# Patient Record
Sex: Male | Born: 1965 | Race: Black or African American | Hispanic: No | Marital: Single | Smoking: Current every day smoker
Health system: Southern US, Community
[De-identification: ages and names within clinical notes are randomized; demographics above are authoritative.]

## PROBLEM LIST (undated history)

## (undated) DIAGNOSIS — I10 Essential (primary) hypertension: Secondary | ICD-10-CM

## (undated) DIAGNOSIS — E785 Hyperlipidemia, unspecified: Secondary | ICD-10-CM

## (undated) DIAGNOSIS — H409 Unspecified glaucoma: Secondary | ICD-10-CM

## (undated) HISTORY — PX: LEG SURGERY: SHX1003

## (undated) HISTORY — PX: LUNG SURGERY: SHX703

---

## 2002-11-30 ENCOUNTER — Ambulatory Visit (HOSPITAL_COMMUNITY): Admission: RE | Admit: 2002-11-30 | Discharge: 2002-11-30 | Payer: Self-pay | Admitting: Internal Medicine

## 2002-11-30 ENCOUNTER — Encounter: Payer: Self-pay | Admitting: Internal Medicine

## 2002-12-27 ENCOUNTER — Emergency Department (HOSPITAL_COMMUNITY): Admission: EM | Admit: 2002-12-27 | Discharge: 2002-12-27 | Payer: Self-pay | Admitting: Emergency Medicine

## 2008-05-08 ENCOUNTER — Emergency Department (HOSPITAL_COMMUNITY): Admission: EM | Admit: 2008-05-08 | Discharge: 2008-05-08 | Payer: Self-pay | Admitting: Emergency Medicine

## 2008-09-16 ENCOUNTER — Emergency Department (HOSPITAL_COMMUNITY): Admission: EM | Admit: 2008-09-16 | Discharge: 2008-09-16 | Payer: Self-pay | Admitting: Emergency Medicine

## 2008-11-27 ENCOUNTER — Emergency Department (HOSPITAL_COMMUNITY): Admission: EM | Admit: 2008-11-27 | Discharge: 2008-11-28 | Payer: Self-pay | Admitting: Emergency Medicine

## 2008-12-20 ENCOUNTER — Encounter: Admission: RE | Admit: 2008-12-20 | Discharge: 2009-03-20 | Payer: Self-pay | Admitting: Orthopaedic Surgery

## 2009-08-25 ENCOUNTER — Encounter (INDEPENDENT_AMBULATORY_CARE_PROVIDER_SITE_OTHER): Payer: Self-pay | Admitting: *Deleted

## 2009-08-25 ENCOUNTER — Ambulatory Visit: Payer: Self-pay | Admitting: Internal Medicine

## 2009-08-25 DIAGNOSIS — K921 Melena: Secondary | ICD-10-CM | POA: Insufficient documentation

## 2009-08-25 DIAGNOSIS — H5702 Anisocoria: Secondary | ICD-10-CM

## 2009-08-25 DIAGNOSIS — K089 Disorder of teeth and supporting structures, unspecified: Secondary | ICD-10-CM | POA: Insufficient documentation

## 2009-09-20 ENCOUNTER — Ambulatory Visit: Payer: Self-pay | Admitting: Gastroenterology

## 2009-11-30 ENCOUNTER — Ambulatory Visit: Payer: Self-pay | Admitting: Internal Medicine

## 2009-11-30 DIAGNOSIS — M545 Low back pain: Secondary | ICD-10-CM

## 2009-12-01 ENCOUNTER — Ambulatory Visit: Payer: Self-pay | Admitting: Internal Medicine

## 2010-07-22 LAB — CONVERTED CEMR LAB
Basophils Absolute: 0 10*3/uL (ref 0.0–0.1)
HDL: 58.9 mg/dL (ref >39.00)
Hemoglobin: 14.2 g/dL (ref 13.0–17.0)
Lymphocytes Relative: 34.6 % (ref 12.0–46.0)
Monocytes Relative: 6.6 % (ref 3.0–12.0)
Neutro Abs: 2.9 10*3/uL (ref 1.4–7.7)
Neutrophils Relative %: 56.5 % (ref 43.0–77.0)
PSA: 1.15 ng/mL (ref 0.10–4.00)
RBC: 5.09 M/uL (ref 4.22–5.81)
RDW: 14.6 % (ref 11.5–14.6)
Triglycerides: 75 mg/dL (ref 0.0–149.0)
VLDL: 15 mg/dL (ref 0.0–40.0)

## 2010-07-24 NOTE — Letter (Signed)
Summary: New Patient letter  [facility]  [address] [place], [place] [address]   Phone: [phone]  Fax: [phone]       08/25/2009 MRN: [id number]  Brian Daugherty [address] [place], [place]  [address]  Dear Mr. Brian Daugherty,  Welcome to the Gastroenterology Division at [facility]    You are scheduled to see Dr.  Russella Dar on 09-20-09 at 10:15am on the 3rd floor at [facility], [address]  We ask that you try to arrive at our office 15 minutes prior to your appointment time to allow for check-in.  We would like you to complete the enclosed self-administered evaluation form prior to your visit and bring it with you on the day of your appointment.  We will review it with you.  Also, please bring a complete list of all your medications or, if you prefer, bring the medication bottles and we will list them.  Please bring your insurance card so that we may make a copy of it.  If your insurance requires a referral to see a specialist, please bring your referral form from your primary care physician.  Co-payments are due at the time of your visit and may be paid by cash, check or credit card.     Your office visit will consist of a consult with your physician (includes a physical exam), any laboratory testing he/she may order, scheduling of any necessary diagnostic testing (e.g. x-ray, ultrasound, CT-scan), and scheduling of a procedure (e.g. Endoscopy, Colonoscopy) if required.  Please allow enough time on your schedule to allow for any/all of these possibilities.    If you cannot keep your appointment, please call [phone] to cancel or reschedule prior to your appointment date.  This allows [place] the opportunity to schedule an appointment for another patient in need of care.  If you do not cancel or reschedule by 5 p.m. the business day prior to your appointment date, you will be charged a $50.00 late cancellation/no-show fee.    Thank you for choosing  [facility] for your medical needs.  We appreciate the opportunity to care for you.  Please visit [place] at our website  to learn more about our practice.                     Sincerely,                                                             The Gastroenterology Division

## 2010-07-24 NOTE — Assessment & Plan Note (Signed)
Summary: NECK & BACK PAIN/Tydus Sanmiguel/CD   Vital Signs:  Patient profile:   45 year old male Height:      71 inches Weight:      154.75 pounds BMI:     21.66 O2 Sat:      97 % on Room air Temp:     97.7 degrees F oral Pulse rate:   73 / minute Pulse rhythm:   regular Resp:     16 per minute BP sitting:   128 / 82  (left arm) Cuff size:   large  Vitals Entered By: Rock Nephew CMA (November 30, 2009 3:17 PM)  O2 Flow:  Room air  Primary Care Provider:  Etta Grandchild MD  CC:  Back pain.  History of Present Illness:  Back Pain      This is a 45 year old man who presents with sharp- Low Back pain.  The symptoms began 5 days ago.  The intensity is described as mild.  The patient denies fever, chills, weakness, loss of sensation, fecal incontinence, urinary incontinence, urinary retention, dysuria, rest pain, inability to work, and inability to care for self.  The pain is located in the mid low back.  The pain began gradually.  The pain radiates to the right hip and left hip.  The pain is made worse by flexion, extension, and activity.  The pain is made better by inactivity and acetaminophen.    Preventive Screening-Counseling & Management  Alcohol-Tobacco     Alcohol drinks/day: 2     Alcohol type: all     >5/day in last 3 mos: no     Alcohol Counseling: not indicated; use of alcohol is not excessive or problematic     Feels need to cut down: no     Feels annoyed by complaints: no     Feels guilty re: drinking: no     Needs 'eye opener' in am: no     Smoking Status: current     Smoking Cessation Counseling: yes     Smoke Cessation Stage: precontemplative     Packs/Day: 0.25     Year Started: 1984     Pack years: 15     Tobacco Counseling: to quit use of tobacco products  Hep-HIV-STD-Contraception     Hepatitis Risk: no risk noted     HIV Risk: no risk noted     STD Risk: no risk noted     SBE monthly: yes     SBE Education/Counseling: to perform regular SBE      Sexual  History:  currently monogamous.        Drug Use:  no.        Blood Transfusions:  no.    Medications Prior to Update: 1)  None  Current Medications (verified): 1)  Amrix 15 Mg Xr24h-Cap (Cyclobenzaprine Hcl) .... One By Mouth Two Times A Day As Needed For Low Back Pain 2)  Naprelan 500 Mg Xr24h-Tab (Naproxen Sodium) .... 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 Family History of Arthritis  Social History: Reviewed history from 08/25/2009 and no changes required. 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   MS:  Complains of low back pain; denies joint pain, joint redness, joint swelling, mid back pain, muscle aches, muscle, muscle weakness, and stiffness. Neuro:  Denies brief paralysis, disturbances in coordination, numbness, poor balance, sensation of room spinning, tingling, and weakness.  Physical Exam  General:  alert, well-developed, well-nourished, well-hydrated, appropriate dress, normal appearance,  healthy-appearing, cooperative to examination, and good hygiene.   Head:  normocephalic, atraumatic, no abnormalities observed, and no abnormalities palpated.   Mouth:  Oral mucosa and oropharynx without lesions or exudates.  Teeth in good repair.no exudates.   Neck:  supple, full ROM, no masses, no thyromegaly, no thyroid nodules or tenderness, no JVD, no HJR, and no carotid bruits.   Lungs:  normal respiratory effort, no intercostal retractions, no accessory muscle use, normal breath sounds, no dullness, no fremitus, no crackles, and no wheezes.   Heart:  normal rate, regular rhythm, no murmur, no gallop, no rub, and no JVD.   Abdomen:  soft, non-tender, normal bowel sounds, no distention, no masses, no guarding, no rebound tenderness, and no inguinal hernia.   Msk:  normal ROM, no joint tenderness, no joint swelling, no joint warmth, no redness over joints, no joint deformities, no joint instability, no crepitation, and no muscle atrophy.   Pulses:  R and L carotid,radial,femoral,dorsalis pedis and posterior tibial pulses are full and equal bilaterally Extremities:  No clubbing, cyanosis, edema, or deformity noted with normal full range of motion of all joints.   Neurologic:  No cranial nerve deficits noted. Station and gait are normal. Plantar reflexes are down-going bilaterally. DTRs are symmetrical throughout. Sensory, motor and coordinative functions appear intact.   Detailed Back/Spine Exam  General:    Well-developed, well-nourished, in no acute distress; alert and oriented x 3.    Gait:    Normal heel-toe gait pattern bilaterally.    Lumbosacral Exam:  Inspection-deformity:    Normal Palpation-spinal tenderness:  Normal Range of Motion:    Forward Flexion:   60 degrees    Hyperextension:   25 degrees    Schober's:        >6 cm    Right Lateral Bend:   25 degrees    Left Lateral Bend:   25 degrees Squatting:  normal Lying Straight Leg Raise:    Right:  negative    Left:   negative Sitting Straight Leg Raise:    Right:  negative    Left:  negative Reverse Straight Leg Raise:    Right:  negative    Left:  negative Contralateral Straight Leg Raise:    Right:  negative    Left:  negative Sciatic Notch:    There is no sciatic notch tenderness. Toe Walking:    Right:  normal    Left:  normal Heel Walking:    Right:  normal    Left:  normal   Impression & Recommendations:  Problem # 1:  LOW BACK PAIN, ACUTE (ICD-724.2) Assessment New  His updated medication list for this problem includes:    Amrix 15 Mg Xr24h-cap (Cyclobenzaprine hcl) ..... One  by mouth two times a day as needed for low back pain    Naprelan 500 Mg Xr24h-tab (Naproxen sodium) ..... One by mouth once daily as needed for low back pain  Orders: T-Lumbar Spine Complete, 5 Views [phone])  Discussed use of moist heat or ice, modified activities, medications, and stretching/strengthening exercises. Back care instructions given. To be seen in 2 weeks if no improvement; sooner if worsening of symptoms.   Complete Medication List: 1)  Amrix 15 Mg Xr24h-cap (Cyclobenzaprine hcl) .... One by mouth two times a day as needed for low back pain 2)  Naprelan 500 Mg Xr24h-tab (Naproxen sodium) .... One by mouth once daily as needed for low back pain  Patient Instructions: 1)  Please schedule a follow-up appointment in 2 weeks. 2)  Take 650-1000mg  of Tylenol every 4-6 hours as needed for relief of pain or comfort of fever AVOID taking more than 4000mg   in a 24 hour period (can cause liver damage in higher doses). 3)  Most patients (90%) with low back pain will improve with time (2-6 weeks). Keep active but avoid activities that are painful. Apply moist heat and/or ice to lower back several times a day. Prescriptions: NAPRELAN 500 MG XR24H-TAB (NAPROXEN SODIUM) One by mouth once daily as needed for low back pain  #8 x 0   Entered and Authorized by:   Etta Grandchild MD   Signed by:   Etta Grandchild MD on 11/30/2009   Method used:   Samples Given   RxID:   [id number] AMRIX 15 MG XR24H-CAP (CYCLOBENZAPRINE HCL) One by mouth two times a day as needed for low back pain  #20 x 0   Entered and Authorized by:   Etta Grandchild MD   Signed by:   Etta Grandchild MD on 11/30/2009   Method used:   Telephoned to ...         RxID:   [id number]

## 2010-07-24 NOTE — Assessment & Plan Note (Signed)
Summary: new / bcbs / #/ cd   Vital Signs:  Patient profile:   45 year old male Height:      71 inches Weight:      158 pounds BMI:     22.12 O2 Sat:      97 % on Room air Temp:     98.3 degrees F oral Pulse rate:   68 / minute Pulse rhythm:   regular Resp:     16 per minute BP sitting:   112 / 82  (left arm) Cuff size:   large  Vitals Entered By: Rock Nephew CMA (August 25, 2009 8:45 AM)  O2 Flow:  Room air  Primary Care Provider:  Etta Grandchild MD   History of Present Illness: New to me for a complete physical with complaints.  1. he had hemorrhoids one month ago and he went to see a doctor in [place] and has been treated with a ? topical med./ he was having pain and BRBPR  2. One year hx. of right eye blurred vision and right pupil is larger than left  3. right upper toothache for several months  Preventive Screening-Counseling & Management  Alcohol-Tobacco     Alcohol drinks/day: 2     Alcohol type: all     >5/day in last 3 mos: no     Alcohol Counseling: not indicated; use of alcohol is not excessive or problematic     Feels need to cut down: no     Feels annoyed by complaints: no     Feels guilty re: drinking: no     Needs 'eye opener' in am: no     Smoking Status: current     Smoking Cessation Counseling: yes     Smoke Cessation Stage: precontemplative     Packs/Day: 0.25     Year Started: 1984     Pack years: 15     Tobacco Counseling: to quit use of tobacco products  Caffeine-Diet-Exercise     Does Patient Exercise: yes  Hep-HIV-STD-Contraception     Hepatitis Risk: no risk noted     HIV Risk: no risk noted     STD Risk: no risk noted     SBE monthly: yes     SBE Education/Counseling: to perform regular SBE      Sexual History:  currently monogamous.        Drug Use:  no.        Blood Transfusions:  no.    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 GI:  Denies abdominal pain, bloody stools, change in bowel habits, constipation, diarrhea, indigestion, loss of appetite, nausea, vomiting, vomiting blood, and yellowish skin color.  Physical Exam  General:  alert, well-developed, well-nourished, well-hydrated, appropriate dress, normal appearance, healthy-appearing, cooperative to examination, and good hygiene.   Head:  normocephalic, atraumatic, no abnormalities observed, and no abnormalities palpated.   Eyes:  right pupil is 5mm and NR left pupil is 2 mm and minimally reactive. vision grossly intact, pupils round, no injection, no iris abnormalities, no retinal abnormalitiies, no nystagmus, and anisocoria.   Ears:  R ear normal and L ear normal.   Nose:  External nasal examination shows no deformity or inflammation. Nasal mucosa are pink and moist without lesions or exudates. Mouth:  Oral mucosa and oropharynx without lesions or exudates.  Teeth in good repair.no exudates.   Neck:  supple, full ROM, no masses, no thyromegaly, no thyroid nodules or tenderness, no JVD, no HJR, and no carotid bruits.   Lungs:  normal respiratory effort, no intercostal retractions, no accessory muscle use, normal breath sounds, no dullness, no fremitus, no crackles, and no wheezes.   Heart:  normal rate, regular rhythm,  no murmur, no gallop, no rub, and no JVD.   Abdomen:  soft, non-tender, normal bowel sounds, no distention, no masses, no guarding, no rebound tenderness, and no inguinal hernia.   Rectal:  normal sphincter tone, no masses, no tenderness, no fissures, no fistulae, no perianal rash, stool positive for occult blood, and internal hemorrhoid(s).   Genitalia:  circumcised, no hydrocele, no varicocele, no scrotal masses, and no testicular masses or atrophy.   Prostate:  Prostate gland firm and smooth, no enlargement, nodularity, tenderness, mass, asymmetry or induration. Msk:  normal ROM, no joint tenderness, no joint swelling, no joint warmth, no redness over joints, no joint deformities, no joint instability, and no crepitation.   Pulses:  R and L carotid,radial,femoral,dorsalis pedis and posterior tibial pulses are full and equal bilaterally Extremities:  No clubbing, cyanosis, edema, or deformity noted with normal full range of motion of all joints.   Neurologic:  No cranial nerve deficits noted. Station and gait are normal. Plantar reflexes are down-going bilaterally. DTRs are symmetrical throughout. Sensory, motor and coordinative functions appear intact. Skin:  turgor normal, color normal, no rashes, no suspicious lesions, no ecchymoses, no petechiae, no purpura, no ulcerations, and no edema.   Cervical Nodes:  no anterior cervical adenopathy and no posterior cervical adenopathy.   Axillary Nodes:  no R axillary adenopathy and no L axillary adenopathy.   Inguinal Nodes:  no R inguinal adenopathy and no L inguinal adenopathy.   Psych:  Cognition and judgment appear intact. Alert and cooperative with normal attention span and concentration. No apparent delusions, illusions, hallucinations Additional Exam:  EKG shows NSR with early repol. and bening abn in p wave.   Impression & Recommendations:  Problem # 1:  UNSPECIFIED DISORDER TEETH&SUPPORTING STRUCTURES (ICD-525.9)  Orders: Dental Referral  (Dentist)  Problem # 2:  ANISOCORIA (ICD-379.41)  Orders: Ophthalmology Referral (Ophthalmology)  Problem # 3:  ROUTINE GENERAL MEDICAL EXAM@HEALTH  CARE FACL (ICD-V70.0) Assessment: New  Orders: Venipuncture (16109) TLB-CBC Platelet - w/Differential (85025-CBCD) TLB-Lipid Panel (80061-LIPID) TLB-PSA (Prostate Specific Antigen) (84153-PSA) EKG w/ Interpretation (93000)  Td Booster: Historical (06/24/2008)    Discussed using sunscreen, use of alcohol, drug use, self testicular exam, routine dental care, routine eye care, routine physical exam, seat belts, multiple vitamins,  and recommendations for immunizations.  Discussed exercise and checking cholesterol.  Discussed gun safety and  safe sex. Also recommend checking PSA.  Problem # 4:  BLOOD IN STOOL (ICD-578.1) Assessment: New  Orders: Venipuncture (60454) Gastroenterology Referral (GI) Hemoccult Guaiac-1 spec.(in office) (82270) TLB-CBC Platelet - w/Differential (85025-CBCD) TLB-Lipid Panel (80061-LIPID) TLB-PSA (Prostate Specific Antigen) (84153-PSA)  Patient Instructions:  1)  Please schedule a follow-up appointment in 2 months.  Preventive Care Screening  Last Tetanus Booster:    Date:  06/24/2008    Results:  Historical     Not Administered:    Influenza Vaccine not given due to: declined

## 2010-07-24 NOTE — Assessment & Plan Note (Signed)
Summary: back pain worse, seen yesterday/cd   Vital Signs:  Patient profile:   45 year old male Height:      71 inches Weight:      154.75 pounds BMI:     21.66 O2 Sat:      98 % on Room air Temp:     97.4 degrees F oral Pulse rate:   72 / minute Pulse rhythm:   regular Resp:     16 per minute BP sitting:   120 / 72  (left arm) Cuff size:   large  Vitals Entered By: Rock Nephew CMA (December 01, 2009 3:48 PM)  O2 Flow:  Room air CC: continued back pain Is Patient Diabetic? No   Primary Care Provider:  Etta Grandchild MD  CC:  continued back pain.  History of Present Illness: He returns and says that prescribed meds did not help with LBP and asks for hydrocodone by name. He apparently took it many months ago for an injury and he liked it. He has no new details or symptoms about the Low back Pain. 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  Preventive Screening-Counseling & Management  Alcohol-Tobacco     Alcohol drinks/day: 2     Alcohol type: all     >5/day in last 3 mos: no     Alcohol Counseling: not indicated; use of alcohol is not excessive or problematic     Feels need to cut down: no     Feels annoyed by complaints: no     Feels guilty re: drinking: no     Needs 'eye opener' in am: no     Smoking Status: current     Smoking Cessation Counseling: yes     Smoke Cessation Stage: precontemplative     Packs/Day: 0.25     Year Started: 1984     Pack years: 15     Tobacco Counseling: to quit use of tobacco products  Medications Prior to Update: 1)  Amrix 15 Mg Xr24h-Cap (Cyclobenzaprine Hcl) .... One By Mouth Two Times A Day As Needed For Low Back Pain 2)  Naprelan 500 Mg Xr24h-Tab (Naproxen Sodium) .... One By Mouth Once Daily As Needed For Low Back Pain  Current Medications (verified): 1)  Tramadol Hcl 50 Mg Tabs (Tramadol Hcl) .Marland Kitchen.. 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 Family History of Arthritis  Social History: Reviewed history from 08/25/2009 and no changes required. 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   MS:  Complains of low back pain; denies joint pain, joint swelling, loss of strength, muscle aches, and muscle weakness.  Physical Exam  General:  Well-developed, well-nourished, in no acute distress; alert and oriented x 3.   Mouth:  Oral mucosa and oropharynx without lesions or exudates.  Teeth in good repair.no exudates.   Neck:  supple, full ROM, no masses, no thyromegaly, no thyroid nodules or tenderness, no JVD, no HJR, and no carotid bruits.   Lungs:  normal respiratory effort, no intercostal retractions, no accessory muscle use, normal breath sounds, no dullness, no fremitus, no crackles, and no wheezes.   Heart:  normal rate, regular rhythm, no murmur, no gallop, no rub, and no JVD.   Abdomen:  soft, non-tender, normal bowel sounds, no distention, no masses, no guarding, no rebound tenderness, and no inguinal hernia.   Msk:  normal ROM, no joint tenderness, no joint swelling, no  joint warmth, no redness over joints, no joint deformities, no joint instability, no crepitation, and no muscle atrophy.   Extremities:  No clubbing, cyanosis, edema, or deformity noted with normal full range of motion of all joints.   Neurologic:  No cranial nerve deficits noted. Station and gait are normal. Plantar reflexes are down-going bilaterally. DTRs are symmetrical throughout. Sensory, motor and coordinative functions appear intact. Skin:  turgor normal, color normal, no rashes, no suspicious lesions, no ecchymoses, no petechiae, no purpura, no ulcerations, and no edema.   Psych:  Cognition and judgment appear intact. Alert and cooperative with normal attention span and concentration. No apparent delusions, illusions, hallucinations   Impression & Recommendations:  Problem # 1:  LOW BACK PAIN, ACUTE (ICD-724.2) Assessment Deteriorated I adivsed him that I did not think hydrocodone was necessary at this time and I told him I was concerned about addiction and dependence with hydrocodone. will try tramadol instead. The following medications were removed from the medication list:    Amrix 15 Mg Xr24h-cap (Cyclobenzaprine hcl) ..... One by mouth two times a day as needed for low back pain    Naprelan 500 Mg Xr24h-tab (Naproxen sodium) ..... One by mouth once daily as needed for low back pain His updated medication list for this problem includes:    Tramadol Hcl 50 Mg Tabs (Tramadol hcl) .Marland Kitchen... 1-2 by mouth qid as needed for pain  Complete Medication List: 1)  Tramadol Hcl 50 Mg Tabs (Tramadol hcl) .Marland Kitchen.. 1-2 by mouth qid as needed for pain  Patient Instructions: 1)  Please schedule a follow-up appointment in 1 month. 2)  Most patients (90%) with low back pain will improve with time (2-6 weeks). Keep active but avoid activities that are painful. Apply moist heat and/or ice to lower back several times a day. Prescriptions: TRAMADOL HCL 50 MG TABS (TRAMADOL HCL) 1-2 by mouth QID as needed for  pain  #50 x 2   Entered and Authorized by:   Etta Grandchild MD   Signed by:   Etta Grandchild MD on 12/01/2009   Method used:   Print then Give to Patient   RxID:   [id number]

## 2010-10-01 LAB — CBC
HCT: 42.8 % (ref 39.0–52.0)
MCHC: 33.7 g/dL (ref 30.0–36.0)
MCV: 84.6 fL (ref 78.0–100.0)
Platelets: 164 10*3/uL (ref 150–400)
RDW: 14.1 % (ref 11.5–15.5)
WBC: 4.5 10*3/uL (ref 4.0–10.5)

## 2010-10-01 LAB — DIFFERENTIAL
Basophils Absolute: 0 10*3/uL (ref 0.0–0.1)
Basophils Relative: 0 % (ref 0–1)
Eosinophils Absolute: 0 10*3/uL (ref 0.0–0.7)
Eosinophils Relative: 0 % (ref 0–5)
Neutrophils Relative %: 71 % (ref 43–77)

## 2010-10-01 LAB — BASIC METABOLIC PANEL
BUN: 16 mg/dL (ref 6–23)
CO2: 28 mEq/L (ref 19–32)
Chloride: 107 mEq/L (ref 96–112)
Creatinine, Ser: 1.19 mg/dL (ref 0.4–1.5)
Glucose, Bld: 102 mg/dL — ABNORMAL HIGH (ref 70–99)

## 2011-04-24 ENCOUNTER — Emergency Department (HOSPITAL_COMMUNITY)
Admission: EM | Admit: 2011-04-24 | Discharge: 2011-04-24 | Discharge status: Against Medical Advice | Payer: Self-pay | Attending: Emergency Medicine | Admitting: Emergency Medicine

## 2011-04-24 DIAGNOSIS — M79609 Pain in unspecified limb: Secondary | ICD-10-CM | POA: Insufficient documentation

## 2011-04-24 DIAGNOSIS — M545 Low back pain, unspecified: Secondary | ICD-10-CM | POA: Insufficient documentation

## 2011-04-24 DIAGNOSIS — G8929 Other chronic pain: Secondary | ICD-10-CM | POA: Insufficient documentation

## 2011-04-24 DIAGNOSIS — M5380 Other specified dorsopathies, site unspecified: Secondary | ICD-10-CM | POA: Insufficient documentation

## 2013-02-02 ENCOUNTER — Encounter (HOSPITAL_COMMUNITY): Payer: Self-pay | Admitting: Nurse Practitioner

## 2013-02-02 ENCOUNTER — Emergency Department (HOSPITAL_COMMUNITY)
Admission: EM | Admit: 2013-02-02 | Discharge: 2013-02-02 | Disposition: A | Discharge status: Home / Self Care | Payer: Self-pay | Attending: Emergency Medicine | Admitting: Emergency Medicine

## 2013-02-02 DIAGNOSIS — H1011 Acute atopic conjunctivitis, right eye: Secondary | ICD-10-CM

## 2013-02-02 DIAGNOSIS — F17200 Nicotine dependence, unspecified, uncomplicated: Secondary | ICD-10-CM | POA: Insufficient documentation

## 2013-02-02 DIAGNOSIS — H1045 Other chronic allergic conjunctivitis: Secondary | ICD-10-CM | POA: Insufficient documentation

## 2013-02-02 DIAGNOSIS — H5789 Other specified disorders of eye and adnexa: Secondary | ICD-10-CM | POA: Insufficient documentation

## 2013-02-02 DIAGNOSIS — H579 Unspecified disorder of eye and adnexa: Secondary | ICD-10-CM | POA: Insufficient documentation

## 2013-02-02 MED ORDER — OLOPATADINE HCL 0.2 % OP SOLN: 1.0000 [drp] | Freq: Every day | OPHTHALMIC | Status: DC

## 2013-02-02 NOTE — ED Provider Notes (Signed)
  CSN: [id number]     Arrival date & time 02/02/13  1138 History     First MD Initiated Contact with Patient 02/02/13 1218     Chief Complaint  Patient presents with  . Eye Pain   (Consider location/radiation/quality/duration/timing/severity/associated sxs/prior Treatment) Patient is a 47 y.o. male presenting with eye pain. The history is provided by the patient and medical records.  Eye Pain  Patient presents to the ED for right eye redness, itching, and burning x2 days. Patient denies any foreign body or chemical exposure to the eye. No trauma to the eye.  States eye continuously water and "looks like he is crying."  Pt does not have any specific environmental allergies.  Denies any purulent drainage or crusting of the eye.  No one at home with similar sx.  Patient wears glasses at baseline, no new visual disturbance.  No photophobia. No hx of glaucoma or cataracts.   History reviewed. No pertinent past medical history. History reviewed. No pertinent past surgical history. History reviewed. No pertinent family history. History  Substance Use Topics  . Smoking status: Not on file  . Smokeless tobacco: Not on file  . Alcohol Use: Not on file    Review of Systems  Eyes: Positive for redness and itching.  All other systems reviewed and are negative.    Allergies  Review of patient's allergies indicates no known allergies.  Home Medications  No current outpatient prescriptions on file. BP 140/93  Pulse 63  Temp(Src) 98.7 F (37.1 C) (Oral)  Resp 20  SpO2 95%  Physical Exam  Nursing note and vitals reviewed. Constitutional: He is oriented to person, place, and time. He appears well-developed and well-nourished. No distress.  HENT:  Head: Normocephalic and atraumatic.  Eyes: EOM and lids are normal. Right conjunctiva is injected. Right conjunctiva has no hemorrhage. Right pupil is not reactive. Left pupil is round and reactive.  Right conjunctiva injected, eye continuously  tearing; no hemorrhage or FB preset, right pupil dilated and non-reactive to light  Neck: Normal range of motion. Neck supple.  Cardiovascular: Normal rate, regular rhythm and normal heart sounds.   Pulmonary/Chest: Effort normal and breath sounds normal. No respiratory distress. He has no wheezes.  Musculoskeletal: Normal range of motion.  Neurological: He is alert and oriented to person, place, and time.  Skin: Skin is warm and dry. He is not diaphoretic.  Psychiatric: He has a normal mood and affect.    ED Course   Procedures (including critical care time)  Labs Reviewed - No data to display No results found. 1. Allergic conjunctivitis, right     MDM   Right pupil dilated and non-reactive-- questioned pt about this and he states this has been present for several years and has been evaluated by his opthalmologist without diagnosis.  Signs/sx consistent with allergic conjunctivitis.  Rx pataday.  Instructed he may wish to take OTC allergy medicine to help with sx.  FU with opthalmologist, Dr. Gwen Pounds, if sx not improving in the next 2-3 days.  Discussed plan with pt, he agreed.  Return precautions advised.  Garlon Hatchet, PA-C 02/02/13 1351

## 2013-02-02 NOTE — ED Notes (Signed)
C/o redness itching and burning to R eye for 2 days. Denies any injuries.

## 2013-02-03 NOTE — ED Provider Notes (Signed)
Medical screening examination/treatment/procedure(s) were performed by non-physician practitioner and as supervising physician I was immediately available for consultation/collaboration.   Dawne Casali M Alveta Quintela, DO 02/03/13 2205 

## 2013-02-04 ENCOUNTER — Emergency Department (HOSPITAL_COMMUNITY)
Admission: EM | Admit: 2013-02-04 | Discharge: 2013-02-04 | Disposition: A | Discharge status: Home / Self Care | Payer: Self-pay | Attending: Emergency Medicine | Admitting: Emergency Medicine

## 2013-02-04 ENCOUNTER — Encounter (HOSPITAL_COMMUNITY): Payer: Self-pay | Admitting: *Deleted

## 2013-02-04 DIAGNOSIS — S0501XD Injury of conjunctiva and corneal abrasion without foreign body, right eye, subsequent encounter: Secondary | ICD-10-CM

## 2013-02-04 DIAGNOSIS — S058X9A Other injuries of unspecified eye and orbit, initial encounter: Secondary | ICD-10-CM | POA: Insufficient documentation

## 2013-02-04 DIAGNOSIS — Y939 Activity, unspecified: Secondary | ICD-10-CM | POA: Insufficient documentation

## 2013-02-04 DIAGNOSIS — X58XXXA Exposure to other specified factors, initial encounter: Secondary | ICD-10-CM | POA: Insufficient documentation

## 2013-02-04 DIAGNOSIS — H5789 Other specified disorders of eye and adnexa: Secondary | ICD-10-CM | POA: Insufficient documentation

## 2013-02-04 DIAGNOSIS — F17200 Nicotine dependence, unspecified, uncomplicated: Secondary | ICD-10-CM | POA: Insufficient documentation

## 2013-02-04 DIAGNOSIS — Y929 Unspecified place or not applicable: Secondary | ICD-10-CM | POA: Insufficient documentation

## 2013-02-04 MED ORDER — FLUORESCEIN SODIUM 1 MG OP STRP
1.0000 | ORAL_STRIP | Freq: Once | OPHTHALMIC | Status: AC
  Administered 2013-02-04: 1 via OPHTHALMIC
  Filled 2013-02-04: qty 1

## 2013-02-04 MED ORDER — ERYTHROMYCIN 5 MG/GM OP OINT: TOPICAL_OINTMENT | Freq: Four times a day (QID) | OPHTHALMIC | Status: DC

## 2013-02-04 MED ORDER — TETRACAINE HCL 0.5 % OP SOLN
2.0000 [drp] | Freq: Once | OPHTHALMIC | Status: AC
  Administered 2013-02-04: 2 [drp] via OPHTHALMIC
  Filled 2013-02-04: qty 2

## 2013-02-04 NOTE — ED Notes (Signed)
PA at bedside for evaluation

## 2013-02-04 NOTE — ED Provider Notes (Signed)
CSN: [id number]     Arrival date & time 02/04/13  1136 History     None    Chief Complaint  Patient presents with  . Eye Pain   (Consider location/radiation/quality/duration/timing/severity/associated sxs/prior Treatment) HPI Comments: Patient is a 47 year old male presenting to the emergency department for continued right eye injection, watery discharge, discomfort. Patient was seen and evaluated 2 days ago and discharged home with a prescription for eyedrops which he was unable to fill due to the cost of the medication. Patient rates his discomfort 8/10. Patient states has been rubbing his eye since his symptoms began several days ago. No alleviating or aggravating factors. He denies any fevers, chills, decreased vision.   History reviewed. No pertinent past medical history. Past Surgical History  Procedure Laterality Date  . Leg surgery     No family history on file. History  Substance Use Topics  . Smoking status: Current Every Day Smoker -- 0.50 packs/day    Types: Cigarettes  . Smokeless tobacco: Not on file  . Alcohol Use: No    Review of Systems  Constitutional: Negative for fever.  HENT: Negative.   Eyes: Positive for discharge, redness and itching. Negative for photophobia and visual disturbance.    Allergies  Review of patient's allergies indicates no known allergies.  Home Medications   Current Outpatient Rx  Name  Route  Sig  Dispense  Refill  . Olopatadine HCl (PATADAY) 0.2 % SOLN   Both Eyes   Place 1 drop into both eyes daily.   1 Bottle   0    Pulse 66  Temp(Src) 98.7 F (37.1 C) (Oral)  Resp 18  SpO2 99% Physical Exam  Constitutional: He is oriented to person, place, and time. He appears well-developed and well-nourished. No distress.  HENT:  Head: Normocephalic and atraumatic.  Right Ear: External ear normal.  Left Ear: External ear normal.  Nose: Nose normal.  Mouth/Throat: Oropharynx is clear and moist.  Eyes: EOM and lids are normal.  Lids are everted and swept, no foreign bodies found. No foreign body present in the right eye. No foreign body present in the left eye. Right conjunctiva is injected. Right conjunctiva has no hemorrhage. Left conjunctiva is not injected. Left conjunctiva has no hemorrhage. Right pupil is round. Left pupil is round and reactive.  Fundoscopic exam:      The right eye shows no hemorrhage and no papilledema.       The left eye shows no hemorrhage and no papilledema.  Slit lamp exam:      The right eye shows corneal abrasion and fluorescein uptake. The right eye shows no foreign body.       The left eye shows no corneal abrasion, no foreign body and no fluorescein uptake.  Right pupil chronically dilated and unresponsive to light. No changes from previous documentation or per patient.  Watery discharge  Neck: Neck supple.  Musculoskeletal: Normal range of motion.  Neurological: He is alert and oriented to person, place, and time.  Skin: Skin is warm and dry. He is not diaphoretic.  Psychiatric: He has a normal mood and affect.    ED Course   Procedures (including critical care time)  Labs Reviewed - No data to display No results found. 1. Corneal abrasion, right, subsequent encounter     MDM  Pt with corneal abrasion on PE. Tdap UTD. Eye irrigated w NS, no evidence of FB.  No change in vision, acuity equal bilaterally.  Pt is not a  contact lens wearer.  Exam non-concerning for orbital cellulitis, hyphema, corneal ulcers. Patient will be discharged home with erythromycin.   Patient understands to follow up with ophthalmology, & to return to ER if new symptoms develop including change in vision, purulent drainage, or entrapment.Patient is agreeable to plan. Patient is stable at time of discharge      Jeannetta Ellis, PA-C 02/04/13 1540

## 2013-02-04 NOTE — ED Provider Notes (Signed)
Medical screening examination/treatment/procedure(s) were performed by non-physician practitioner and as supervising physician I was immediately available for consultation/collaboration.  David Masneri, MD 02/04/13 1904 

## 2013-02-04 NOTE — ED Notes (Signed)
Pt was seen here several days ago and dx with rt eye pain d/t allergy.  Pt did not buy the medicine (Olopatadine hcl) b/c it was $140.  He is back b/c the eye has not improved.

## 2014-03-04 ENCOUNTER — Encounter (HOSPITAL_COMMUNITY): Payer: Self-pay | Admitting: Emergency Medicine

## 2014-03-04 ENCOUNTER — Emergency Department (HOSPITAL_COMMUNITY)
Admission: EM | Admit: 2014-03-04 | Discharge: 2014-03-04 | Disposition: A | Discharge status: Home / Self Care | Payer: Self-pay | Attending: Emergency Medicine | Admitting: Emergency Medicine

## 2014-03-04 DIAGNOSIS — F17200 Nicotine dependence, unspecified, uncomplicated: Secondary | ICD-10-CM | POA: Insufficient documentation

## 2014-03-04 DIAGNOSIS — Z79899 Other long term (current) drug therapy: Secondary | ICD-10-CM | POA: Insufficient documentation

## 2014-03-04 DIAGNOSIS — L089 Local infection of the skin and subcutaneous tissue, unspecified: Secondary | ICD-10-CM

## 2014-03-04 DIAGNOSIS — L723 Sebaceous cyst: Secondary | ICD-10-CM | POA: Insufficient documentation

## 2014-03-04 DIAGNOSIS — IMO0002 Reserved for concepts with insufficient information to code with codable children: Secondary | ICD-10-CM | POA: Insufficient documentation

## 2014-03-04 MED ORDER — HYDROCODONE-ACETAMINOPHEN 5-325 MG PO TABS: 1.0000 | ORAL_TABLET | Freq: Four times a day (QID) | ORAL | Status: DC | PRN

## 2014-03-04 MED ORDER — LIDOCAINE HCL (PF) 1 % IJ SOLN
INTRAMUSCULAR | Status: AC
  Filled 2014-03-04: qty 5

## 2014-03-04 MED ORDER — CEPHALEXIN 500 MG PO CAPS: 500.0000 mg | ORAL_CAPSULE | Freq: Four times a day (QID) | ORAL | Status: DC

## 2014-03-04 MED ORDER — LIDOCAINE-EPINEPHRINE 2 %-1:100000 IJ SOLN
20.0000 mL | Freq: Once | INTRAMUSCULAR | Status: AC
  Administered 2014-03-04: 20 mL via INTRADERMAL
  Filled 2014-03-04 (×2): qty 20

## 2014-03-04 NOTE — ED Notes (Signed)
Pt c/o knot under right arm x 1 week. Pt denies drainage.

## 2014-03-04 NOTE — Discharge Instructions (Signed)
Keflex for infection until all gone. Warm compresses to the area. Ibuprofen and tylenol for pain. Norco for severe pain. Return in 2 days for recheck or sooner as needed   Abscess An abscess is an infected area that contains a collection of pus and debris.It can occur in almost any part of the body. An abscess is also known as a furuncle or boil. CAUSES  An abscess occurs when tissue gets infected. This can occur from blockage of oil or sweat glands, infection of hair follicles, or a minor injury to the skin. As the body tries to fight the infection, pus collects in the area and creates pressure under the skin. This pressure causes pain. People with weakened immune systems have difficulty fighting infections and get certain abscesses more often.  SYMPTOMS Usually an abscess develops on the skin and becomes a painful mass that is red, warm, and tender. If the abscess forms under the skin, you may feel a moveable soft area under the skin. Some abscesses break open (rupture) on their own, but most will continue to get worse without care. The infection can spread deeper into the body and eventually into the bloodstream, causing you to feel ill.  DIAGNOSIS  Your caregiver will take your medical history and perform a physical exam. A sample of fluid may also be taken from the abscess to determine what is causing your infection. TREATMENT  Your caregiver may prescribe antibiotic medicines to fight the infection. However, taking antibiotics alone usually does not cure an abscess. Your caregiver may need to make a small cut (incision) in the abscess to drain the pus. In some cases, gauze is packed into the abscess to reduce pain and to continue draining the area. HOME CARE INSTRUCTIONS   Only take over-the-counter or prescription medicines for pain, discomfort, or fever as directed by your caregiver.  If you were prescribed antibiotics, take them as directed. Finish them even if you start to feel  better.  If gauze is used, follow your caregiver's directions for changing the gauze.  To avoid spreading the infection:  Keep your draining abscess covered with a bandage.  Wash your hands well.  Do not share personal care items, towels, or whirlpools with others.  Avoid skin contact with others.  Keep your skin and clothes clean around the abscess.  Keep all follow-up appointments as directed by your caregiver. SEEK MEDICAL CARE IF:   You have increased pain, swelling, redness, fluid drainage, or bleeding.  You have muscle aches, chills, or a general ill feeling.  You have a fever. MAKE SURE YOU:   Understand these instructions.  Will watch your condition.  Will get help right away if you are not doing well or get worse. Document Released: 03/20/2005 Document Revised: 12/10/2011 Document Reviewed: 08/23/2011 [facility] Patient Information 2015 [place], [place]. This information is not intended to replace advice given to you by your health care provider. Make sure you discuss any questions you have with your health care provider.

## 2014-03-04 NOTE — ED Provider Notes (Signed)
CSN: [id number]     Arrival date & time 03/04/14  7829 History  This chart was scribed for non-physician practitioner, Jaynie Crumble, PA-C,working with Ward Givens, MD, by Karle Plumber, ED Scribe. This patient was seen in room TR09C/TR09C and the patient's care was started at 10:29 AM.  Chief Complaint  Patient presents with  . Abscess   Patient is a 48 y.o. male presenting with abscess. The history is provided by the patient. No language interpreter was used.  Abscess Associated symptoms: no fever    HPI Comments:  Brian Daugherty is a 48 y.o. male who presents to the Emergency Department complaining of a worsening abscess to the right axilla that began as a "pimple" about one weak ago. He reports the area has gotten red, hard and very painful. Movement of his right arm makes the pain worse. He reports using an unknown cream on the area with no relief. He denies fever, chills, cough, congestion or sore throat. He states he has had one other abscess in the past but never in this area.  History reviewed. No pertinent past medical history. Past Surgical History  Procedure Laterality Date  . Leg surgery     No family history on file. History  Substance Use Topics  . Smoking status: Current Every Day Smoker -- 0.50 packs/day    Types: Cigarettes  . Smokeless tobacco: Not on file  . Alcohol Use: No    Review of Systems  Constitutional: Negative for fever and chills.  HENT: Negative for congestion and sore throat.   Respiratory: Negative for cough.   Skin: Positive for color change.       Abscess to right axilla    Allergies  Review of patient's allergies indicates no known allergies.  Home Medications   Prior to Admission medications   Medication Sig Start Date End Date Taking? Authorizing Provider  erythromycin ophthalmic ointment Apply to eye every 6 (six) hours. Place 1/2 inch ribbon of ointment in the affected eye 4 times a day 02/04/13   Victorino Dike L Piepenbrink, PA-C   Olopatadine HCl (PATADAY) 0.2 % SOLN Place 1 drop into both eyes daily. 02/02/13   Garlon Hatchet, PA-C   Triage Vitals: BP 132/89  Pulse 77  Temp(Src) 97.9 F (36.6 C) (Oral)  Resp 18  Ht  (1.803 m)  Wt 160 lb (72.576 kg)  BMI 22.33 kg/m2  SpO2 99% Physical Exam  Nursing note and vitals reviewed. Constitutional: He is oriented to person, place, and time. He appears well-developed and well-nourished.  HENT:  Head: Normocephalic and atraumatic.  Eyes: EOM are normal.  Neck: Normal range of motion.  Cardiovascular: Normal rate.   Pulmonary/Chest: Effort normal.  Musculoskeletal: Normal range of motion.  Neurological: He is alert and oriented to person, place, and time.  Skin: Skin is warm and dry.  4 x 6 cm area of swelling, induration, fluctuance to the right lower axilla. Area is erythematous, tender to palpation. No drainage  Psychiatric: He has a normal mood and affect. His behavior is normal.    ED Course  Procedures (including critical care time) DIAGNOSTIC STUDIES: Oxygen Saturation is 99% on RA, normal by my interpretation.   COORDINATION OF CARE: 10:30 AM- Will I & D abscess. Pt verbalizes understanding and agrees to plan.  INCISION AND DRAINAGE PROCEDURE NOTE: Patient identification was confirmed and verbal consent was obtained. This procedure was performed by Jaynie Crumble, PA-C at 11:11 AM. Site: right axilla Sterile procedures observed Needle size:  25 G Anesthetic used (type and amt): Lidocaine 2% with Epinephrine (7 mLs) Blade size: 11 Drainage: moderate sebaceous material Complexity: Complex Packing used: Iodoform 1/4" Site anesthetized, incision made over site, wound drained and explored loculations, rinsed with copious amounts of normal saline, wound packed with sterile gauze, covered with dry, sterile dressing.  Pt tolerated procedure well without complications.  Instructions for care discussed verbally and pt provided with additional  written instructions for homecare and f/u.   Medications  lidocaine-EPINEPHrine (XYLOCAINE W/EPI) 2 %-1:100000 (with pres) injection 20 mL (not administered)    Labs Review Labs Reviewed - No data to display  Imaging Review No results found.   EKG Interpretation None      MDM   Final diagnoses:  None    Abscess incised and drained, with large sebaceous material and purulent drainage. I suspect this is most likely sebaceous cyst that is infected. Incised and drained, packed. Will treat with Keflex and Norco. Patient is low risk for MRSA infection. Instructed to return in 2 days for recheck or sooner for worsening.   Filed Vitals:   03/04/14 0949 03/04/14 1152  BP: 132/89 153/94  Pulse: 77 68  Temp: 97.9 F (36.6 C) 98.3 F (36.8 C)  TempSrc: Oral Oral  Resp: 18 16  Height:  (1.803 m)   Weight: 160 lb (72.576 kg)   SpO2: 99% 100%     I personally performed the services described in this documentation, which was scribed in my presence. The recorded information has been reviewed and is accurate.    Lottie Mussel, PA-C 03/04/14 1636

## 2014-03-06 ENCOUNTER — Encounter (HOSPITAL_COMMUNITY): Payer: Self-pay | Admitting: Emergency Medicine

## 2014-03-06 ENCOUNTER — Emergency Department (HOSPITAL_COMMUNITY)
Admission: EM | Admit: 2014-03-06 | Discharge: 2014-03-06 | Disposition: A | Discharge status: Home / Self Care | Payer: Self-pay | Attending: Emergency Medicine | Admitting: Emergency Medicine

## 2014-03-06 DIAGNOSIS — F17200 Nicotine dependence, unspecified, uncomplicated: Secondary | ICD-10-CM | POA: Insufficient documentation

## 2014-03-06 DIAGNOSIS — L02411 Cutaneous abscess of right axilla: Secondary | ICD-10-CM

## 2014-03-06 DIAGNOSIS — Z792 Long term (current) use of antibiotics: Secondary | ICD-10-CM | POA: Insufficient documentation

## 2014-03-06 DIAGNOSIS — Z5189 Encounter for other specified aftercare: Secondary | ICD-10-CM

## 2014-03-06 DIAGNOSIS — IMO0002 Reserved for concepts with insufficient information to code with codable children: Secondary | ICD-10-CM | POA: Insufficient documentation

## 2014-03-06 DIAGNOSIS — Z4801 Encounter for change or removal of surgical wound dressing: Secondary | ICD-10-CM | POA: Insufficient documentation

## 2014-03-06 MED ORDER — IBUPROFEN 800 MG PO TABS: 800.0000 mg | ORAL_TABLET | Freq: Three times a day (TID) | ORAL | Status: DC | PRN

## 2014-03-06 MED ORDER — SULFAMETHOXAZOLE-TRIMETHOPRIM 800-160 MG PO TABS: 1.0000 | ORAL_TABLET | Freq: Two times a day (BID) | ORAL | Status: DC

## 2014-03-06 MED ORDER — OXYCODONE-ACETAMINOPHEN 5-325 MG PO TABS: 2.0000 | ORAL_TABLET | ORAL | Status: DC | PRN

## 2014-03-06 NOTE — ED Provider Notes (Signed)
Medical screening examination/treatment/procedure(s) were performed by non-physician practitioner and as supervising physician I was immediately available for consultation/collaboration.   EKG Interpretation None        Abdulloh Ullom, MD 03/06/14 2315 

## 2014-03-06 NOTE — ED Notes (Signed)
Declined W/C at D/C and was escorted to lobby by RN. 

## 2014-03-06 NOTE — ED Notes (Signed)
The pt had a rt axilla abscess drained 2 days ago.  He is back today for a wound chesk

## 2014-03-06 NOTE — ED Provider Notes (Signed)
CSN: [id number]     Arrival date & time 03/06/14  1610 History   First MD Initiated Contact with Patient 03/06/14 1712     This chart was scribed for non-physician practitioner, Trixie Dredge PA-C working with Elwin Mocha, MD by Arlan Organ, ED Scribe. This patient was seen in room TR08C/TR08C and the patient's care was started at 5:44 PM.   Chief Complaint  Patient presents with  . Wound Check   The history is provided by the patient. No language interpreter was used.    HPI Comments: Micajah Dennin is a 48 y.o. male who presents to the Emergency Department here for wound check today. Pt was seen 03/04/14 for an abscess to the R axilla. I&D performed with instruction to return for recheck. Pt was sent home with antibiotics (keflex) and pain medication(norco) to manage symptoms. Mr. Krass reports ongoing, unchanged pain to the area along with drainage. No worsening symptoms since initial visit. He is taking antibiotic and pain medication as prescribed and directed. No fever or chills since time of onset. No known allergies to medications. No other concerns this visit.  History reviewed. No pertinent past medical history. Past Surgical History  Procedure Laterality Date  . Leg surgery     No family history on file. History  Substance Use Topics  . Smoking status: Current Every Day Smoker -- 0.50 packs/day    Types: Cigarettes  . Smokeless tobacco: Not on file  . Alcohol Use: No    Review of Systems  Constitutional: Negative for fever and chills.  Musculoskeletal: Negative for myalgias.  Skin: Positive for wound. Negative for color change, pallor and rash.  Allergic/Immunologic: Negative for immunocompromised state.  Neurological: Negative for weakness and numbness.  Hematological: Does not bruise/bleed easily.  All other systems reviewed and are negative.     Allergies  Review of patient's allergies indicates no known allergies.  Home Medications   Prior to Admission  medications   Medication Sig Start Date End Date Taking? Authorizing Provider  cephALEXin (KEFLEX) 500 MG capsule Take 1 capsule (500 mg total) by mouth 4 (four) times daily. 03/04/14   Tatyana A Kirichenko, PA-C  HYDROcodone-acetaminophen (NORCO) 5-325 MG per tablet Take 1 tablet by mouth every 6 (six) hours as needed. 03/04/14   Tatyana A Kirichenko, PA-C   Triage Vitals: BP 137/95  Pulse 65  Temp(Src) 99.2 F (37.3 C) (Oral)  Resp 12  Ht  (1.803 m)  Wt 160 lb (72.576 kg)  BMI 22.33 kg/m2  SpO2 95%   Physical Exam  Nursing note and vitals reviewed. Constitutional: He appears well-developed and well-nourished. No distress.  HENT:  Head: Normocephalic and atraumatic.  Neck: Neck supple.  Pulmonary/Chest: Effort normal.  Neurological: He is alert.  Skin: He is not diaphoretic.  R axilla with edematous area with purulent drainage and packing in place Mild overlying erythema noted Tender to palpation.     ED Course  Procedures (including critical care time)  DIAGNOSTIC STUDIES: Oxygen Saturation is 95% on RA, Adequate by my interpretation.    COORDINATION OF CARE: 5:44 PM- Will provide additional antibiotic and pain medication to help manage symptoms. Discussed treatment plan with pt at bedside and pt agreed to plan.      Labs Review Labs Reviewed - No data to display  Imaging Review No results found.   EKG Interpretation None        MDM   Final diagnoses:  Abscess of axilla, right  Wound check, abscess  Afebrile, nontoxic patient with right axillary abscess, thoguht to be infected sebaceous cyst, I&D 2 days ago.  Pt reports the swelling and coloration are unchanged, reports pain continues and is unchanged.  He does have an enlarged area with some mild overlying erythema.  There is purulent discharge on the packing material, which I removed.  Will add bactrim and change pain medication to percocet.   D/C home.  Discussed result, findings, treatment, and  follow up  with patient.  Pt given return precautions.  Pt verbalizes understanding and agrees with plan.       I personally performed the services described in this documentation, which was scribed in my presence. The recorded information has been reviewed and is accurate.    Trixie Dredge, PA-C 03/06/14 1825

## 2014-03-06 NOTE — Discharge Instructions (Signed)
Read the information below.  Use the prescribed medication as directed.  Please discuss all new medications with your pharmacist.  Do not take additional tylenol while taking the prescribed pain medication to avoid overdose.  You may return to the Emergency Department at any time for worsening condition or any new symptoms that concern you.  If you develop increased redness, swelling, excessive pus draining from the wound, or fevers greater than 100.4, return to the ER immediately for a recheck.     Abscess Care After An abscess (also called a boil or furuncle) is an infected area that contains a collection of pus. Signs and symptoms of an abscess include pain, tenderness, redness, or hardness, or you may feel a moveable soft area under your skin. An abscess can occur anywhere in the body. The infection may spread to surrounding tissues causing cellulitis. A cut (incision) by the surgeon was made over your abscess and the pus was drained out. Gauze may have been packed into the space to provide a drain that will allow the cavity to heal from the inside outwards. The boil may be painful for 5 to 7 days. Most people with a boil do not have high fevers. Your abscess, if seen early, may not have localized, and may not have been lanced. If not, another appointment may be required for this if it does not get better on its own or with medications. HOME CARE INSTRUCTIONS   Only take over-the-counter or prescription medicines for pain, discomfort, or fever as directed by your caregiver.  When you bathe, soak and then remove gauze or iodoform packs at least daily or as directed by your caregiver. You may then wash the wound gently with mild soapy water. Repack with gauze or do as your caregiver directs. SEEK IMMEDIATE MEDICAL CARE IF:   You develop increased pain, swelling, redness, drainage, or bleeding in the wound site.  You develop signs of generalized infection including muscle aches, chills, fever, or a  general ill feeling.  An oral temperature above 102 F (38.9 C) develops, not controlled by medication. See your caregiver for a recheck if you develop any of the symptoms described above. If medications (antibiotics) were prescribed, take them as directed. Document Released: 12/27/2004 Document Revised: 09/02/2011 Document Reviewed: 08/24/2007 [facility] Patient Information 2015 [place], [place]. This information is not intended to replace advice given to you by your health care provider. Make sure you discuss any questions you have with your health care provider.

## 2014-03-15 NOTE — ED Provider Notes (Signed)
Medical screening examination/treatment/procedure(s) were performed by non-physician practitioner and as supervising physician I was immediately available for consultation/collaboration.   EKG Interpretation None      Devoria Albe, MD, Armando Gang   Ward Givens, MD 03/15/14 [phone]

## 2014-10-17 ENCOUNTER — Emergency Department (HOSPITAL_COMMUNITY)
Admission: EM | Admit: 2014-10-17 | Discharge: 2014-10-17 | Disposition: A | Discharge status: Home / Self Care | Attending: Emergency Medicine | Admitting: Emergency Medicine

## 2014-10-17 ENCOUNTER — Encounter (HOSPITAL_COMMUNITY): Payer: Self-pay | Admitting: *Deleted

## 2014-10-17 DIAGNOSIS — Y9389 Activity, other specified: Secondary | ICD-10-CM | POA: Diagnosis not present

## 2014-10-17 DIAGNOSIS — Y288XXA Contact with other sharp object, undetermined intent, initial encounter: Secondary | ICD-10-CM | POA: Diagnosis not present

## 2014-10-17 DIAGNOSIS — Z720 Tobacco use: Secondary | ICD-10-CM | POA: Diagnosis not present

## 2014-10-17 DIAGNOSIS — Z792 Long term (current) use of antibiotics: Secondary | ICD-10-CM | POA: Insufficient documentation

## 2014-10-17 DIAGNOSIS — S61012A Laceration without foreign body of left thumb without damage to nail, initial encounter: Secondary | ICD-10-CM | POA: Diagnosis not present

## 2014-10-17 DIAGNOSIS — Y998 Other external cause status: Secondary | ICD-10-CM | POA: Insufficient documentation

## 2014-10-17 DIAGNOSIS — Y9289 Other specified places as the place of occurrence of the external cause: Secondary | ICD-10-CM | POA: Insufficient documentation

## 2014-10-17 DIAGNOSIS — S61412A Laceration without foreign body of left hand, initial encounter: Secondary | ICD-10-CM

## 2014-10-17 MED ORDER — LIDOCAINE HCL (PF) 1 % IJ SOLN
5.0000 mL | Freq: Once | INTRAMUSCULAR | Status: DC
  Filled 2014-10-17: qty 5

## 2014-10-17 NOTE — Discharge Instructions (Signed)
Sutures will need to be removed in 7 days. Return as needed for signs of infection.

## 2014-10-17 NOTE — ED Notes (Signed)
Patient verbalizes understanding of discharge instructions, wound care, and follow up care for suture removal. Patient ambulatory out of department at this time in police custody. Report called to nurse D[place] with [facility].

## 2014-10-17 NOTE — ED Provider Notes (Signed)
CSN: [id number]     Arrival date & time 10/17/14  2217 History   First MD Initiated Contact with Patient 10/17/14 2246     Chief Complaint  Patient presents with  . Laceration     (Consider location/radiation/quality/duration/timing/severity/associated sxs/prior Treatment) HPI Brian Daugherty is a 49 y.o. male who presents to the ED in custody from [facility]. He has a laceration to the left thumb. He thinks he may have cut it on a piece of wood. He went to the nurse and she tried to close it with steri strips but could not get the wound to stop bleeding. Patient denies any other injuries.   History reviewed. No pertinent past medical history. Past Surgical History  Procedure Laterality Date  . Leg surgery     History reviewed. No pertinent family history. History  Substance Use Topics  . Smoking status: Current Every Day Smoker -- 0.50 packs/day    Types: Cigarettes  . Smokeless tobacco: Not on file  . Alcohol Use: No    Review of Systems Negative except as stated in HPI   Allergies  Review of patient's allergies indicates no known allergies.  Home Medications   Prior to Admission medications   Medication Sig Start Date End Date Taking? Authorizing Provider  cephALEXin (KEFLEX) 500 MG capsule Take 1 capsule (500 mg total) by mouth 4 (four) times daily. 03/04/14   Tatyana Kirichenko, PA-C  HYDROcodone-acetaminophen (NORCO) 5-325 MG per tablet Take 1 tablet by mouth every 6 (six) hours as needed. 03/04/14   Tatyana Kirichenko, PA-C  ibuprofen (ADVIL,MOTRIN) 800 MG tablet Take 1 tablet (800 mg total) by mouth every 8 (eight) hours as needed for mild pain or moderate pain. 03/06/14   Trixie Dredge, PA-C  oxyCODONE-acetaminophen (PERCOCET/ROXICET) 5-325 MG per tablet Take 2 tablets by mouth every 4 (four) hours as needed for severe pain. 03/06/14   Trixie Dredge, PA-C  sulfamethoxazole-trimethoprim (SEPTRA DS) 800-160 MG per tablet Take 1 tablet by mouth 2 (two) times  daily. 03/06/14   Trixie Dredge, PA-C   BP 159/105 mmHg  Pulse 52  Temp(Src) 98 F (36.7 C) (Oral)  Resp 18  Ht  (1.803 m)  Wt 185 lb (83.915 kg)  BMI 25.81 kg/m2  SpO2 98% Physical Exam  Constitutional: He is oriented to person, place, and time. He appears well-developed and well-nourished. No distress.  Eyes: EOM are normal.  Neck: Neck supple.  Cardiovascular: Normal rate.   Pulmonary/Chest: Effort normal.  Abdominal: Soft. There is no tenderness.  Musculoskeletal: Normal range of motion.       Left hand: He exhibits tenderness and laceration. He exhibits normal range of motion, no deformity and no swelling. Normal sensation noted. Normal strength noted.       Hands: Neurological: He is alert and oriented to person, place, and time. No cranial nerve deficit.  Skin: Skin is warm and dry.  Psychiatric: He has a normal mood and affect. His behavior is normal.  Nursing note and vitals reviewed.   ED Course  LACERATION REPAIR Date/Time: 10/18/2014 12:09 AM Performed by: Janne Napoleon Authorized by: Janne Napoleon Consent: Verbal consent obtained. Risks and benefits: risks, benefits and alternatives were discussed Consent given by: patient Patient understanding: patient states understanding of the procedure being performed Required items: required blood products, implants, devices, and special equipment available Patient identity confirmed: verbally with patient Body area: upper extremity Location details: left thumb Laceration length: 2 cm Tendon involvement: none Nerve involvement: none Vascular  damage: no Anesthesia: local infiltration Local anesthetic: lidocaine 1% without epinephrine Anesthetic total: 3 ml Patient sedated: no Preparation: Patient was prepped and draped in the usual sterile fashion. Irrigation solution: saline Irrigation method: syringe Amount of cleaning: standard Debridement: none Degree of undermining: none Skin closure: 4-0 Prolene Number  of sutures: 5 Technique: simple Approximation: close Dressing: pressure dressing Patient tolerance: Patient tolerated the procedure well with no immediate complications  Tetanus updated  MDM  49 y.o. male with laceration to the left thumb, stable for d/c without neurovascular compromise. He will have the nurse at the [facility] change the dressing tomorrow and recheck his BP. Sutures to be removed in 7 days. He will return for any signs of infection.  Final diagnoses:  Laceration of hand, left, initial encounter      [facility] Psope M Neese, NP 10/18/14 0013  Dione Boozeavid Glick, MD 10/18/14 [phone]

## 2014-10-17 NOTE — ED Notes (Signed)
Pt reports cutting left thumb "on something.  Maybe a piece of wood".  Wound dressed prior to arrival to department.

## 2018-07-16 ENCOUNTER — Emergency Department (HOSPITAL_BASED_OUTPATIENT_CLINIC_OR_DEPARTMENT_OTHER)
Admission: EM | Admit: 2018-07-16 | Discharge: 2018-07-16 | Disposition: A | Discharge status: Home / Self Care | Attending: Emergency Medicine | Admitting: Emergency Medicine

## 2018-07-16 ENCOUNTER — Other Ambulatory Visit: Payer: Self-pay

## 2018-07-16 ENCOUNTER — Encounter (HOSPITAL_BASED_OUTPATIENT_CLINIC_OR_DEPARTMENT_OTHER): Payer: Self-pay | Admitting: *Deleted

## 2018-07-16 DIAGNOSIS — F17210 Nicotine dependence, cigarettes, uncomplicated: Secondary | ICD-10-CM | POA: Insufficient documentation

## 2018-07-16 DIAGNOSIS — H1033 Unspecified acute conjunctivitis, bilateral: Secondary | ICD-10-CM | POA: Insufficient documentation

## 2018-07-16 DIAGNOSIS — I10 Essential (primary) hypertension: Secondary | ICD-10-CM | POA: Insufficient documentation

## 2018-07-16 DIAGNOSIS — H409 Unspecified glaucoma: Secondary | ICD-10-CM | POA: Insufficient documentation

## 2018-07-16 HISTORY — DX: Unspecified glaucoma: H40.9

## 2018-07-16 HISTORY — DX: Essential (primary) hypertension: I10

## 2018-07-16 HISTORY — DX: Hyperlipidemia, unspecified: E78.5

## 2018-07-16 MED ORDER — POLYMYXIN B-TRIMETHOPRIM 10000-0.1 UNIT/ML-% OP SOLN
1.0000 [drp] | OPHTHALMIC | Status: DC
  Administered 2018-07-16: 1 [drp] via OPHTHALMIC
  Filled 2018-07-16: qty 10

## 2018-07-16 NOTE — Discharge Instructions (Signed)
Use the drops in both eyes 4 times a day.  Also make sure you are not sharing towels or washclothes as this is contagious.  If you start having vision problems you need to return immediately or see the eye doctor

## 2018-07-16 NOTE — ED Notes (Signed)
NAD at this time. Pt is stable and going home.  

## 2018-07-16 NOTE — ED Triage Notes (Signed)
Pt woke up this morning with both eyes pink, sore and crusted over. Symptoms started yesterday, but worsened today.

## 2018-07-16 NOTE — ED Provider Notes (Signed)
[facility] EMERGENCY DEPARTMENT Provider Note   CSN: [id number] Arrival date & time: 07/16/18  7510     History   Chief Complaint Chief Complaint  Patient presents with  . Conjunctivitis    HPI Brian Daugherty is a 53 y.o. male.  The history is provided by the patient.  Conjunctivitis  This is a new problem. The current episode started 2 days ago. The problem occurs constantly. The problem has been gradually worsening. Associated symptoms comments: Started in the left eye with redness, watering and discharge and moved to the right eye yesterday.  Wears glasses but no contacts.  No recent URI and no exposure to children.  No allergies or recent exposure.  No vision changes.. Nothing aggravates the symptoms. Nothing relieves the symptoms. He has tried nothing for the symptoms. The treatment provided no relief.    Past Medical History:  Diagnosis Date  . Glaucoma   . Hyperlipemia   . Hypertension     Patient Active Problem List   Diagnosis Date Noted  . LOW BACK PAIN, ACUTE 11/30/2009  . ANISOCORIA 08/25/2009  . UNSPECIFIED DISORDER TEETH&SUPPORTING STRUCTURES 08/25/2009  . BLOOD IN STOOL 08/25/2009    Past Surgical History:  Procedure Laterality Date  . LEG SURGERY    . LUNG SURGERY          Home Medications    Prior to Admission medications   Medication Sig Start Date End Date Taking? Authorizing Provider  cephALEXin (KEFLEX) 500 MG capsule Take 1 capsule (500 mg total) by mouth 4 (four) times daily. 03/04/14   Kirichenko, Lemont Fillers, PA-C  HYDROcodone-acetaminophen (NORCO) 5-325 MG per tablet Take 1 tablet by mouth every 6 (six) hours as needed. 03/04/14   Kirichenko, Lemont Fillers, PA-C  ibuprofen (ADVIL,MOTRIN) 800 MG tablet Take 1 tablet (800 mg total) by mouth every 8 (eight) hours as needed for mild pain or moderate pain. 03/06/14   Trixie Dredge, PA-C  oxyCODONE-acetaminophen (PERCOCET/ROXICET) 5-325 MG per tablet Take 2 tablets by mouth every 4 (four) hours as  needed for severe pain. 03/06/14   Trixie Dredge, PA-C  sulfamethoxazole-trimethoprim (SEPTRA DS) 800-160 MG per tablet Take 1 tablet by mouth 2 (two) times daily. 03/06/14   Trixie Dredge, PA-C    Family History History reviewed. No pertinent family history.  Social History Social History   Tobacco Use  . Smoking status: Current Every Day Smoker    Packs/day: 0.50    Types: Cigarettes  . Smokeless tobacco: Never Used  Substance Use Topics  . Alcohol use: No  . Drug use: No     Allergies   Patient has no known allergies.   Review of Systems Review of Systems  All other systems reviewed and are negative.    Physical Exam Updated Vital Signs BP (!) 150/102 (BP Location: Right Arm)   Pulse 90   Temp 98 F (36.7 C) (Oral)   Resp 18   Ht 5\' 11"  (1.803 m)   Wt 86.2 kg   SpO2 100%   BMI 26.50 kg/m   Physical Exam Vitals signs and nursing note reviewed.  Constitutional:      General: He is not in acute distress.    Appearance: Normal appearance. He is normal weight.  HENT:     Head: Normocephalic.     Nose: Nose normal.     Mouth/Throat:     Mouth: Mucous membranes are moist.  Eyes:     General:        Right eye: Discharge  present.     Extraocular Movements: Extraocular movements intact.     Conjunctiva/sclera:     Right eye: Right conjunctiva is injected. Exudate present.     Left eye: Left conjunctiva is injected. Exudate present.     Comments: Right pupil is 55mm and minimally reactive and left pupil is 35mm and reactive  Cardiovascular:     Rate and Rhythm: Normal rate.     Pulses: Normal pulses.  Pulmonary:     Effort: Pulmonary effort is normal.  Skin:    General: Skin is warm.     Capillary Refill: Capillary refill takes less than 2 seconds.  Neurological:     General: No focal deficit present.     Mental Status: He is alert. Mental status is at baseline.  Psychiatric:        Mood and Affect: Mood normal.      ED Treatments / Results  Labs (all  labs ordered are listed, but only abnormal results are displayed) Labs Reviewed - No data to display  EKG None  Radiology No results found.  Procedures Procedures (including critical care time)  Medications Ordered in ED Medications  trimethoprim-polymyxin b (POLYTRIM) ophthalmic solution 1 drop (has no administration in time range)     Initial Impression / Assessment and Plan / ED Course  I have reviewed the triage vital signs and the nursing notes.  Pertinent labs & imaging results that were available during my care of the patient were reviewed by me and considered in my medical decision making (see chart for details).     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  Started in the left eye and has now moved to the right with injection, tearing, drainage and mild soreness.  He has no visual changes and low suspicion for acute angle glaucoma.  He does not wear contact lenses and has no signs concerning for corneal ulcer or history concerning for abrasion.  He denies any sinus symptoms or recent URIs.  Otherwise he is well-appearing.  Most likely viral versus bacterial.  He has no history of allergies or recent new exposures.  Patient was started on Polytrim to use 4 times a day and follow-up with ophthalmology if not improving.  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 Also while here patient's blood pressure is elevated.  Discussed this with him and he forgot to take his blood pressure medication this morning.  Also he takes drops daily for his glaucoma and has not stopped using those.   Final Clinical Impressions(s) / ED Diagnoses   Final diagnoses:  Acute conjunctivitis of both eyes, unspecified acute conjunctivitis type    ED Discharge Orders    None         Gwyneth Sprout, MD 07/16/18 [phone]

## 2018-07-18 ENCOUNTER — Other Ambulatory Visit: Payer: Self-pay

## 2018-07-18 ENCOUNTER — Emergency Department (HOSPITAL_BASED_OUTPATIENT_CLINIC_OR_DEPARTMENT_OTHER)
Admission: EM | Admit: 2018-07-18 | Discharge: 2018-07-18 | Disposition: A | Discharge status: Home / Self Care | Attending: Emergency Medicine | Admitting: Emergency Medicine

## 2018-07-18 ENCOUNTER — Encounter (HOSPITAL_BASED_OUTPATIENT_CLINIC_OR_DEPARTMENT_OTHER): Payer: Self-pay | Admitting: Emergency Medicine

## 2018-07-18 DIAGNOSIS — H109 Unspecified conjunctivitis: Secondary | ICD-10-CM

## 2018-07-18 DIAGNOSIS — H1033 Unspecified acute conjunctivitis, bilateral: Secondary | ICD-10-CM | POA: Insufficient documentation

## 2018-07-18 DIAGNOSIS — F17210 Nicotine dependence, cigarettes, uncomplicated: Secondary | ICD-10-CM | POA: Insufficient documentation

## 2018-07-18 DIAGNOSIS — Z79899 Other long term (current) drug therapy: Secondary | ICD-10-CM | POA: Insufficient documentation

## 2018-07-18 MED ORDER — FLUORESCEIN SODIUM 1 MG OP STRP
2.0000 | ORAL_STRIP | Freq: Once | OPHTHALMIC | Status: AC
  Administered 2018-07-18: 2 via OPHTHALMIC
  Filled 2018-07-18: qty 2

## 2018-07-18 MED ORDER — TETRACAINE HCL 0.5 % OP SOLN
2.0000 [drp] | Freq: Once | OPHTHALMIC | Status: DC
  Filled 2018-07-18: qty 4

## 2018-07-18 MED ORDER — DEXAMETHASONE 4 MG PO TABS
8.0000 mg | ORAL_TABLET | Freq: Once | ORAL | Status: AC
  Administered 2018-07-18: 8 mg via ORAL
  Filled 2018-07-18: qty 2

## 2018-07-18 MED ORDER — FLUORESCEIN SODIUM 1 MG OP STRP
1.0000 | ORAL_STRIP | Freq: Once | OPHTHALMIC | Status: DC
  Filled 2018-07-18: qty 1

## 2018-07-18 MED ORDER — TETRACAINE HCL 0.5 % OP SOLN
2.0000 [drp] | Freq: Once | OPHTHALMIC | Status: AC
  Administered 2018-07-18: 2 [drp] via OPHTHALMIC
  Filled 2018-07-18: qty 4

## 2018-07-18 NOTE — ED Triage Notes (Signed)
Patient states that he was here yesterday for his "pink eye" and was given a medications - reports that it is spreading and getting worse - he also states that he had a sore throat yesterday but didn't mention it and now it is worse

## 2018-07-18 NOTE — ED Provider Notes (Signed)
[facility] EMERGENCY DEPARTMENT Provider Note   CSN: [id number] Arrival date & time: 07/18/18  [id number]     History   Chief Complaint Chief Complaint  Patient presents with  . Sore Throat  . Eye Problem    HPI Brian Daugherty is a 53 y.o. male.  HPI   53 year old male with eye redness, tearing and discomfort.  Onset about 3 days ago.  He was seen in the emergency room yesterday for the same.  He was diagnosed with conjunctivitis.  He was prescribed Polytrim for possible bacterial cause.  He has been using this and he feels like symptoms have worsened.  This morning he woke up and both his eyes were matted shut.  Glasses.  No contacts.  Past history of glaucoma.  He works reports compliance with his medications.  Mild intermittent blurred vision.  Is also concerned about a sore throat and he says difficulty swallowing.  No fevers.  Past Medical History:  Diagnosis Date  . Glaucoma   . Hyperlipemia   . Hypertension     Patient Active Problem List   Diagnosis Date Noted  . LOW BACK PAIN, ACUTE 11/30/2009  . ANISOCORIA 08/25/2009  . UNSPECIFIED DISORDER TEETH&SUPPORTING STRUCTURES 08/25/2009  . BLOOD IN STOOL 08/25/2009    Past Surgical History:  Procedure Laterality Date  . LEG SURGERY    . LUNG SURGERY          Home Medications    Prior to Admission medications   Medication Sig Start Date End Date Taking? Authorizing Provider  cephALEXin (KEFLEX) 500 MG capsule Take 1 capsule (500 mg total) by mouth 4 (four) times daily. 03/04/14   Kirichenko, Lemont Fillersatyana, PA-C  HYDROcodone-acetaminophen (NORCO) 5-325 MG per tablet Take 1 tablet by mouth every 6 (six) hours as needed. 03/04/14   Kirichenko, Lemont Fillersatyana, PA-C  ibuprofen (ADVIL,MOTRIN) 800 MG tablet Take 1 tablet (800 mg total) by mouth every 8 (eight) hours as needed for mild pain or moderate pain. 03/06/14   Trixie DredgeWest, Emily, PA-C  oxyCODONE-acetaminophen (PERCOCET/ROXICET) 5-325 MG per tablet Take 2 tablets by mouth every 4  (four) hours as needed for severe pain. 03/06/14   Trixie DredgeWest, Emily, PA-C  sulfamethoxazole-trimethoprim (SEPTRA DS) 800-160 MG per tablet Take 1 tablet by mouth 2 (two) times daily. 03/06/14   Trixie DredgeWest, Emily, PA-C    Family History History reviewed. No pertinent family history.  Social History Social History   Tobacco Use  . Smoking status: Current Every Day Smoker    Packs/day: 0.50    Types: Cigarettes  . Smokeless tobacco: Never Used  Substance Use Topics  . Alcohol use: No  . Drug use: No     Allergies   Patient has no known allergies.   Review of Systems Review of Systems  All systems reviewed and negative, other than as noted in HPI.  Physical Exam Updated Vital Signs BP 112/81 (BP Location: Left Arm)   Pulse 89   Temp 98.5 F (36.9 C) (Oral)   Resp 18   Ht 5\' 11"  (1.803 m)   Wt 86.2 kg   SpO2 100%   BMI 26.50 kg/m   Physical Exam Vitals signs and nursing note reviewed.  Constitutional:      General: He is not in acute distress.    Appearance: He is well-developed.  HENT:     Head: Normocephalic and atraumatic.     Nose: No congestion or rhinorrhea.     Mouth/Throat:     Comments: Oropharynx is clear.  Uvula is mildly edematous.  Midline.  Posterior pharynx is clear.  Handling secretions.  Normal sounding voice.  I do not appreciate any adenopathy.  Neck is supple. Eyes:     Comments: Periorbital tissues unremarkable.  Bilateral conjunctival injection.  No chemosis.  some mild tearing.  Right pupil is approximately 4 mm and does not really react.  Pupils 2-3 and reactive.  IOP 16 bilaterally.  Fluorescein without signs of abrasion, keratitis or other concerning lesions.  Neck:     Musculoskeletal: Neck supple.  Cardiovascular:     Rate and Rhythm: Normal rate and regular rhythm.     Heart sounds: Normal heart sounds. No murmur. No friction rub. No gallop.   Pulmonary:     Effort: Pulmonary effort is normal. No respiratory distress.     Breath sounds: Normal  breath sounds.  Abdominal:     General: There is no distension.     Palpations: Abdomen is soft.     Tenderness: There is no abdominal tenderness.  Musculoskeletal:        General: No tenderness.  Skin:    General: Skin is warm and dry.  Neurological:     Mental Status: He is alert.  Psychiatric:        Behavior: Behavior normal.        Thought Content: Thought content normal.      ED Treatments / Results  Labs (all labs ordered are listed, but only abnormal results are displayed) Labs Reviewed - No data to display  EKG None  Radiology No results found.  Procedures Procedures (including critical care time)  Medications Ordered in ED Medications  dexamethasone (DECADRON) tablet 8 mg (has no administration in time range)  tetracaine (PONTOCAINE) 0.5 % ophthalmic solution 2 drop (2 drops Both Eyes Given by Other 07/18/18 0913)  fluorescein ophthalmic strip 2 strip (2 strips Both Eyes Given 07/18/18 0913)     Initial Impression / Assessment and Plan / ED Course  I have reviewed the triage vital signs and the nursing notes.  Pertinent labs & imaging results that were available during my care of the patient were reviewed by me and considered in my medical decision making (see chart for details).     53 year old male with bilateral conjunctivitis.  Suspect that this is viral in nature.  He feels like symptoms are worsening but I am not overly concerned based on my exam today.  Advised to continue antibiotics though.  Discussed cool compresses.  Can try taking Benadryl.  He is also concerned about a sore throat.  This may be some mild uvulitis but otherwise his seems pretty unremarkable.  He was given a one-time dose of Decadron.  No concerning signs of airway compromise or deep space infection.  Discussed natural course of viral conjunctivitis.  Reiterated good hand hygiene.  I expect his symptoms to be improving by the end of the weekend.  Emergent return precautions were  discussed.  Ophthalmology follow-up otherwise.  Final Clinical Impressions(s) / ED Diagnoses   Final diagnoses:  Conjunctivitis of both eyes, unspecified conjunctivitis type    ED Discharge Orders    None       Raeford Razor, MD 07/18/18 [phone]

## 2020-07-23 ENCOUNTER — Encounter (HOSPITAL_BASED_OUTPATIENT_CLINIC_OR_DEPARTMENT_OTHER): Payer: Self-pay | Admitting: Emergency Medicine

## 2020-07-23 ENCOUNTER — Emergency Department (HOSPITAL_BASED_OUTPATIENT_CLINIC_OR_DEPARTMENT_OTHER): Payer: Self-pay

## 2020-07-23 ENCOUNTER — Emergency Department (HOSPITAL_BASED_OUTPATIENT_CLINIC_OR_DEPARTMENT_OTHER)
Admission: EM | Admit: 2020-07-23 | Discharge: 2020-07-23 | Disposition: A | Discharge status: Home / Self Care | Payer: Self-pay | Attending: Emergency Medicine | Admitting: Emergency Medicine

## 2020-07-23 ENCOUNTER — Other Ambulatory Visit: Payer: Self-pay

## 2020-07-23 DIAGNOSIS — F17210 Nicotine dependence, cigarettes, uncomplicated: Secondary | ICD-10-CM | POA: Insufficient documentation

## 2020-07-23 DIAGNOSIS — I10 Essential (primary) hypertension: Secondary | ICD-10-CM | POA: Insufficient documentation

## 2020-07-23 DIAGNOSIS — R079 Chest pain, unspecified: Secondary | ICD-10-CM

## 2020-07-23 LAB — CBC
HCT: 44.9 % (ref 39.0–52.0)
Hemoglobin: 15.1 g/dL (ref 13.0–17.0)
MCH: 28.2 pg (ref 26.0–34.0)
MCHC: 33.6 g/dL (ref 30.0–36.0)
MCV: 83.9 fL (ref 80.0–100.0)
Platelets: 173 10*3/uL (ref 150–400)
RBC: 5.35 MIL/uL (ref 4.22–5.81)
RDW: 14.9 % (ref 11.5–15.5)
WBC: 3.7 10*3/uL — ABNORMAL LOW (ref 4.0–10.5)
nRBC: 0 % (ref 0.0–0.2)

## 2020-07-23 LAB — BASIC METABOLIC PANEL
Anion gap: 8 (ref 5–15)
BUN: 19 mg/dL (ref 6–20)
CO2: 24 mmol/L (ref 22–32)
Calcium: 8.8 mg/dL — ABNORMAL LOW (ref 8.9–10.3)
Chloride: 106 mmol/L (ref 98–111)
Creatinine, Ser: 0.96 mg/dL (ref 0.61–1.24)
GFR, Estimated: >60 mL/min (ref >60)
Glucose, Bld: 96 mg/dL (ref 70–99)
Potassium: 4.5 mmol/L (ref 3.5–5.1)
Sodium: 138 mmol/L (ref 135–145)

## 2020-07-23 LAB — TROPONIN I (HIGH SENSITIVITY): Troponin I (High Sensitivity): <2 ng/L (ref <18)

## 2020-07-23 MED ORDER — METHOCARBAMOL 500 MG PO TABS: 500.0000 mg | ORAL_TABLET | Freq: Three times a day (TID) | ORAL | 0 refills | Status: DC | PRN

## 2020-07-23 NOTE — ED Triage Notes (Signed)
R side chest pain x 2 days. Pain is worse with inspiration. Denies cough or recent illness.

## 2020-07-23 NOTE — Discharge Instructions (Signed)
Your chest pain does not appear to be coming from your heart or pneumonia or blood clots or collapsed lung.  Potentially could be coming from the chest wall.  See if the muscle relaxers help.  Follow-up with your doctor as needed.

## 2020-07-23 NOTE — ED Notes (Signed)
Patient transported to X-ray. Labs to be drawn upon return.
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
## 2021-03-08 ENCOUNTER — Other Ambulatory Visit: Payer: Self-pay

## 2021-03-08 ENCOUNTER — Emergency Department (HOSPITAL_BASED_OUTPATIENT_CLINIC_OR_DEPARTMENT_OTHER)
Admission: EM | Admit: 2021-03-08 | Discharge: 2021-03-08 | Disposition: A | Discharge status: Home / Self Care | Payer: Self-pay | Attending: Emergency Medicine | Admitting: Emergency Medicine

## 2021-03-08 ENCOUNTER — Emergency Department (HOSPITAL_BASED_OUTPATIENT_CLINIC_OR_DEPARTMENT_OTHER): Payer: Self-pay

## 2021-03-08 ENCOUNTER — Encounter (HOSPITAL_BASED_OUTPATIENT_CLINIC_OR_DEPARTMENT_OTHER): Payer: Self-pay

## 2021-03-08 DIAGNOSIS — I10 Essential (primary) hypertension: Secondary | ICD-10-CM | POA: Insufficient documentation

## 2021-03-08 DIAGNOSIS — S46912A Strain of unspecified muscle, fascia and tendon at shoulder and upper arm level, left arm, initial encounter: Secondary | ICD-10-CM

## 2021-03-08 DIAGNOSIS — R030 Elevated blood-pressure reading, without diagnosis of hypertension: Secondary | ICD-10-CM

## 2021-03-08 DIAGNOSIS — X58XXXA Exposure to other specified factors, initial encounter: Secondary | ICD-10-CM | POA: Insufficient documentation

## 2021-03-08 DIAGNOSIS — F17210 Nicotine dependence, cigarettes, uncomplicated: Secondary | ICD-10-CM | POA: Insufficient documentation

## 2021-03-08 DIAGNOSIS — S53402A Unspecified sprain of left elbow, initial encounter: Secondary | ICD-10-CM | POA: Insufficient documentation

## 2021-03-08 MED ORDER — OXYCODONE-ACETAMINOPHEN 5-325 MG PO TABS
1.0000 | ORAL_TABLET | Freq: Once | ORAL | Status: AC
  Administered 2021-03-08: 1 via ORAL
  Filled 2021-03-08: qty 1

## 2021-03-08 MED ORDER — NAPROXEN 500 MG PO TABS: 500.0000 mg | ORAL_TABLET | Freq: Two times a day (BID) | ORAL | 0 refills | Status: DC

## 2021-03-08 NOTE — Discharge Instructions (Addendum)
Recommend taking the prescribed anti-inflammatory.  Recommend follow-up with sports medicine or orthopedics regarding her joint pain.  Regarding your blood pressure, you should follow-up with a primary care doctor.  As much as possible, would recommend resting your left arm for the next few days.

## 2021-03-08 NOTE — ED Notes (Signed)
D/c paperwork reviewed with pt, including prescriptions and importance of following up with BP. Pt verbalized understanding, with no question or concerns at time of d/c. Pt ambulatory to ED exit to meet ride.

## 2021-03-08 NOTE — ED Provider Notes (Signed)
[facility] EMERGENCY DEPARTMENT Provider Note   CSN: [id number] Arrival date & time: 03/08/21  0757     History Chief Complaint  Patient presents with   Elbow Pain    Brian Daugherty is a 55 y.o. male.  Presents to ER with concern for elbow pain.  Works in [employer], uses his arms frequently.  Started noticing pain about 2 weeks ago after work 1 day.  Pain seems to be worsened with significant movement, improved with rest.  Also had some improvement with Motrin.  Pain is currently moderate.  Aching.  Denies any medical problems.  HPI     Past Medical History:  Diagnosis Date   Glaucoma    Hyperlipemia    Hypertension     Patient Active Problem List   Diagnosis Date Noted   LOW BACK PAIN, ACUTE 11/30/2009   ANISOCORIA 08/25/2009   UNSPECIFIED DISORDER TEETH&SUPPORTING STRUCTURES 08/25/2009   BLOOD IN STOOL 08/25/2009    Past Surgical History:  Procedure Laterality Date   LEG SURGERY     LUNG SURGERY         History reviewed. No pertinent family history.  Social History   Tobacco Use   Smoking status: Every Day    Packs/day: 0.50    Types: Cigarettes   Smokeless tobacco: Never  Substance Use Topics   Alcohol use: No   Drug use: No    Home Medications Prior to Admission medications   Medication Sig Start Date End Date Taking? Authorizing Provider  naproxen (NAPROSYN) 500 MG tablet Take 1 tablet (500 mg total) by mouth 2 (two) times daily. 03/08/21  Yes Naysha Sholl, Quitman Livings, MD  cephALEXin (KEFLEX) 500 MG capsule Take 1 capsule (500 mg total) by mouth 4 (four) times daily. 03/04/14   Kirichenko, Lemont Fillers, PA-C  HYDROcodone-acetaminophen (NORCO) 5-325 MG per tablet Take 1 tablet by mouth every 6 (six) hours as needed. 03/04/14   Kirichenko, Lemont Fillers, PA-C  ibuprofen (ADVIL,MOTRIN) 800 MG tablet Take 1 tablet (800 mg total) by mouth every 8 (eight) hours as needed for mild pain or moderate pain. 03/06/14   Trixie Dredge, PA-C  methocarbamol  (ROBAXIN) 500 MG tablet Take 1 tablet (500 mg total) by mouth every 8 (eight) hours as needed for muscle spasms. 07/23/20   Benjiman Core, MD  oxyCODONE-acetaminophen (PERCOCET/ROXICET) 5-325 MG per tablet Take 2 tablets by mouth every 4 (four) hours as needed for severe pain. 03/06/14   Trixie Dredge, PA-C  sulfamethoxazole-trimethoprim (SEPTRA DS) 800-160 MG per tablet Take 1 tablet by mouth 2 (two) times daily. 03/06/14   Trixie Dredge, PA-C    Allergies    Patient has no known allergies.  Review of Systems   Review of Systems  Musculoskeletal:  Positive for arthralgias.  All other systems reviewed and are negative.  Physical Exam Updated Vital Signs BP (!) 185/106 (BP Location: Right Arm)   Pulse 66   Temp 98.6 F (37 C) (Oral)   Resp 16   Ht 5\' 11"  (1.803 m)   Wt 74.8 kg   SpO2 98%   BMI 23.01 kg/m   Physical Exam Vitals and nursing note reviewed.  Constitutional:      Appearance: He is well-developed.  HENT:     Head: Normocephalic and atraumatic.  Eyes:     Conjunctiva/sclera: Conjunctivae normal.  Cardiovascular:     Rate and Rhythm: Normal rate and regular rhythm.     Heart sounds: No murmur heard. Pulmonary:     Effort: Pulmonary effort  is normal. No respiratory distress.  Musculoskeletal:     Cervical back: Neck supple.     Comments: Left upper extremity: There is some tenderness over the elbow joint but no significant deformity appreciated, no swelling, normal ROM, distal sensation, radial pulse intact  Skin:    General: Skin is warm and dry.  Neurological:     General: No focal deficit present.     Mental Status: He is alert.  Psychiatric:        Mood and Affect: Mood normal.        Behavior: Behavior normal.    ED Results / Procedures / Treatments   Labs (all labs ordered are listed, but only abnormal results are displayed) Labs Reviewed - No data to display  EKG None  Radiology DG Elbow Complete Left  Result Date: 03/08/2021 CLINICAL DATA:   Left elbow pain for 2 weeks EXAM: LEFT ELBOW - COMPLETE 3+ VIEW COMPARISON:  None. FINDINGS: There is no acute fracture or dislocation. Elbow alignment is normal. The joint spaces are preserved. The soft tissues are normal. There is no effusion. IMPRESSION: Unremarkable elbow radiographs. Electronically Signed   By: Lesia Hausen M.D.   On: 03/08/2021 08:48    Procedures Procedures   Medications Ordered in ED Medications  oxyCODONE-acetaminophen (PERCOCET/ROXICET) 5-325 MG per tablet 1 tablet (1 tablet Oral Given 03/08/21 6629)    ED Course  I have reviewed the triage vital signs and the nursing notes.  Pertinent labs & imaging results that were available during my care of the patient were reviewed by me and considered in my medical decision making (see chart for details).    MDM Rules/Calculators/A&P                           55 year old male presenting to ER with concern for left elbow pain.  No trauma.  On exam some tenderness to the joint but no deformity appreciated.  Normal ROM.  No fever.  Suspect MSK strain.  X-ray negative.  Neurovascular intact.  Recommend trial of NSAIDs, follow-up with sports medicine.  Incidentally patient noted to have elevated blood pressure.  Patient denies having prior history of hypertension but does not have a regular doctor.  Recommended he follow-up with a primary care doctor to have BP rechecked and discuss long term management, reviewed return precautions and discharged.    After the discussed management above, the patient was determined to be safe for discharge.  The patient was in agreement with this plan and all questions regarding their care were answered.  ED return precautions were discussed and the patient will return to the ED with any significant worsening of condition.  Final Clinical Impression(s) / ED Diagnoses Final diagnoses:  Strain of left elbow, initial encounter  Elevated blood pressure reading    Rx / DC Orders ED Discharge  Orders          Ordered    naproxen (NAPROSYN) 500 MG tablet  2 times daily        03/08/21 0858             Milagros Loll, MD 03/08/21 [phone]

## 2021-03-08 NOTE — ED Triage Notes (Signed)
Left elbow pain x 2 weeks, worse today, denies injury.  Pain feels like it is in the anterior joint per patient.  Patient works in [employer] so he uses his hands and arms a lot.

## 2021-03-08 NOTE — ED Notes (Signed)
Left elbow pain x 2 weeks, worse today.  Patient has ben taking Ibuprofen at home with moderate relief but pain returns when it wears off.  Denies injury, no obvious deformity.

## 2022-01-17 ENCOUNTER — Emergency Department (HOSPITAL_BASED_OUTPATIENT_CLINIC_OR_DEPARTMENT_OTHER): Payer: Self-pay

## 2022-01-17 ENCOUNTER — Emergency Department (HOSPITAL_BASED_OUTPATIENT_CLINIC_OR_DEPARTMENT_OTHER)
Admission: EM | Admit: 2022-01-17 | Discharge: 2022-01-17 | Disposition: A | Discharge status: Home / Self Care | Payer: Self-pay | Attending: Emergency Medicine | Admitting: Emergency Medicine

## 2022-01-17 ENCOUNTER — Other Ambulatory Visit: Payer: Self-pay

## 2022-01-17 ENCOUNTER — Encounter (HOSPITAL_BASED_OUTPATIENT_CLINIC_OR_DEPARTMENT_OTHER): Payer: Self-pay | Admitting: Emergency Medicine

## 2022-01-17 DIAGNOSIS — M109 Gout, unspecified: Secondary | ICD-10-CM | POA: Insufficient documentation

## 2022-01-17 MED ORDER — COLCHICINE 0.6 MG PO TABS
0.6000 mg | ORAL_TABLET | Freq: Once | ORAL | Status: AC
  Administered 2022-01-17: 0.6 mg via ORAL
  Filled 2022-01-17: qty 1

## 2022-01-17 MED ORDER — INDOMETHACIN 50 MG PO CAPS: 50.0000 mg | ORAL_CAPSULE | Freq: Two times a day (BID) | ORAL | 0 refills | Status: AC

## 2022-01-17 NOTE — ED Provider Notes (Signed)
[facility] EMERGENCY DEPARTMENT Provider Note   CSN: [id number] Arrival date & time: 01/17/22  2143     History  Chief Complaint  Patient presents with   Toe Pain    Zacory Fiola is a 56 y.o. male.   Toe Pain  56 year old male presents emergency department with 1 day history of left great toe pain.  He states is a history of hitting his toe while walking upstairs but that pain has since subsided up until today.  He describes the pain as sharp and needlelike in nature.  It is isolated to the joint between his metatarsal and proximal phalanx.  He denies any known mechanism of injury or repeat trauma.  Denies fever, history of gout, breaks in skin, diabetic neuropathy.  He states he has good feeling in both feet.  Home Medications Prior to Admission medications   Medication Sig Start Date End Date Taking? Authorizing Provider  indomethacin (INDOCIN) 50 MG capsule Take 1 capsule (50 mg total) by mouth 2 (two) times daily with a meal for 5 days. 01/17/22 01/22/22 Yes Sherian Maroon A, PA  cephALEXin (KEFLEX) 500 MG capsule Take 1 capsule (500 mg total) by mouth 4 (four) times daily. 03/04/14   Kirichenko, Lemont Fillers, PA-C  HYDROcodone-acetaminophen (NORCO) 5-325 MG per tablet Take 1 tablet by mouth every 6 (six) hours as needed. 03/04/14   Kirichenko, Lemont Fillers, PA-C  ibuprofen (ADVIL,MOTRIN) 800 MG tablet Take 1 tablet (800 mg total) by mouth every 8 (eight) hours as needed for mild pain or moderate pain. 03/06/14   Trixie Dredge, PA-C  methocarbamol (ROBAXIN) 500 MG tablet Take 1 tablet (500 mg total) by mouth every 8 (eight) hours as needed for muscle spasms. 07/23/20   Benjiman Core, MD  oxyCODONE-acetaminophen (PERCOCET/ROXICET) 5-325 MG per tablet Take 2 tablets by mouth every 4 (four) hours as needed for severe pain. 03/06/14   Trixie Dredge, PA-C  sulfamethoxazole-trimethoprim (SEPTRA DS) 800-160 MG per tablet Take 1 tablet by mouth 2 (two) times daily. 03/06/14   Trixie Dredge, PA-C       Allergies    Patient has no known allergies.    Review of Systems   Review of Systems  All other systems reviewed and are negative.   Physical Exam Updated Vital Signs BP (!) 160/101 (BP Location: Left Arm)   Pulse 63   Temp 98.6 F (37 C) (Oral)   Resp 16   Ht 5\' 11"  (1.803 m)   Wt 77.1 kg   SpO2 100%   BMI 23.71 kg/m  Physical Exam Vitals and nursing note reviewed.  Constitutional:      General: He is not in acute distress.    Appearance: He is well-developed.  HENT:     Head: Normocephalic and atraumatic.  Eyes:     Conjunctiva/sclera: Conjunctivae normal.  Cardiovascular:     Rate and Rhythm: Normal rate and regular rhythm.     Heart sounds: No murmur heard. Pulmonary:     Effort: Pulmonary effort is normal. No respiratory distress.     Breath sounds: Normal breath sounds.  Abdominal:     Palpations: Abdomen is soft.     Tenderness: There is no abdominal tenderness.  Musculoskeletal:        General: No swelling.     Cervical back: Neck supple.       Feet:  Feet:     Comments: Posterior tibial and radial pulses full and intact bilaterally.  No sensory deficits distal to affected digit.  Patient  has tenderness to palpation of metatarsal phalangeal joint of first digit of the foot.  Pain is elicited with flexion of affected digit at affected joint.  Area is not warm to the touch.  No obvious breaks in skin.  Patient has he has good sensation in the area.  Capillary refill less than 2 seconds. Skin:    General: Skin is warm and dry.     Capillary Refill: Capillary refill takes less than 2 seconds.  Neurological:     Mental Status: He is alert.  Psychiatric:        Mood and Affect: Mood normal.     ED Results / Procedures / Treatments   Labs (all labs ordered are listed, but only abnormal results are displayed) Labs Reviewed - No data to display  EKG None  Radiology DG Toe Great Left  Result Date: 01/17/2022 CLINICAL DATA:  Pain. Patient states  he hit his left big toe 2 months ago, reports pain increased tonight with pain when bending his toe. EXAM: LEFT GREAT TOE COMPARISON:  None Available. FINDINGS: There is no evidence of fracture or dislocation. There is no evidence of arthropathy or other focal bone abnormality. Soft tissues are unremarkable. IMPRESSION: Negative. Electronically Signed   By: Tish Frederickson M.D.   On: 01/17/2022 22:40    Procedures Procedures    Medications Ordered in ED Medications  colchicine tablet 0.6 mg (0.6 mg Oral Given 01/17/22 2222)    ED Course/ Medical Decision Making/ A&P                           Medical Decision Making Amount and/or Complexity of Data Reviewed Radiology: ordered.  Risk Prescription drug management.  This patient presents to the ED for concern of toe pain, this involves an extensive number of treatment options, and is a complaint that carries with it a high risk of complications and morbidity.  The differential diagnosis includes gout, osteo myelitis, fracture, strain/sprain   Co morbidities that complicate the patient evaluation  Glaucoma, hyperlipidemia, hypertension   Additional history obtained:  Additional history obtained from EMR External records from outside source obtained and reviewed including GFR greater than 60 from 07/23/2020   Lab Tests:  N/a   Imaging Studies ordered:  I ordered imaging studies including left great toe x-ray I independently visualized and interpreted imaging which showed no acute abnormality I agree with the radiologist interpretation   Cardiac Monitoring: / EKG:  The patient was maintained on a cardiac monitor.  I personally viewed and interpreted the cardiac monitored which showed an underlying rhythm of: Sinus rhythm   Consultations Obtained:  N/a   Problem List / ED Course / Critical interventions / Medication management  Gout I ordered medication including colchicine for gout  Reevaluation of the patient  after these medicines showed that the patient improved I have reviewed the patients home medicines and have made adjustments as needed   Social Determinants of Health:  Chronic cigarette use, alcohol use, denies illicit drug use   Test / Admission - Considered:  Gout Vitals signs significant for hypertension with a blood pressure 160/101.  Recommend close follow-up with PCP for further monitoring/treatment of blood pressure. Otherwise within normal range and stable throughout visit. Imaging studies significant for: See above Patient presentation as well as physical exam most likely consistent with acute gout flare.  Patient will be treated with indomethacin outpatient.  Doubt septic arthritis at this time, doubt osteomyelitis, doubt  fracture.  Follow-up with PCP recommended for further gout management.  Patient given resources regarding purine rich foods to avoid. Worrisome signs and symptoms were discussed with the patient, and the patient acknowledged understanding to return to the ED if noticed. Patient was stable upon discharge.          Final Clinical Impression(s) / ED Diagnoses Final diagnoses:  Podagra    Rx / DC Orders ED Discharge Orders          Ordered    indomethacin (INDOCIN) 50 MG capsule  2 times daily with meals        01/17/22 2249              Peter Garter, [place] 01/17/22 2252    Melene Plan, DO 01/17/22 2325

## 2022-01-17 NOTE — Discharge Instructions (Addendum)
Your work-up today was overall most consistent with gout of the affected joint.  We treat this with indomethacin twice a day for 5 days.  Take this medicines with meal.  Avoid taking Advil or ibuprofen with this medicine as it is in the same family.  Follow-up with your primary care provider regarding your current diagnosis of gout.  There are medicines you can be put on to help decrease the recurrence of this.  Read the information attached.  There is a diet helps decrease the likelihood of you getting gout in the future; it is called a low purine diet.  Read attached information for details.  Please not hesitate to return to emergency department for worrisome signs and symptoms we discussed become apparent.

## 2022-01-17 NOTE — ED Triage Notes (Signed)
Patient states he hit his left big toe 2 months ago, reports pain increased tonight with pain when bending his toe.

## 2022-03-18 IMAGING — CR DG CHEST 2V
2 series · 2 of 2 positions shown · non-contrast
Comparison: Chest x-ray dated 11/27/2008

CLINICAL DATA: Chest pain, RIGHT-sided chest pressure for 2 days.

EXAM:
CHEST - 2 VIEW

[w chest pa]
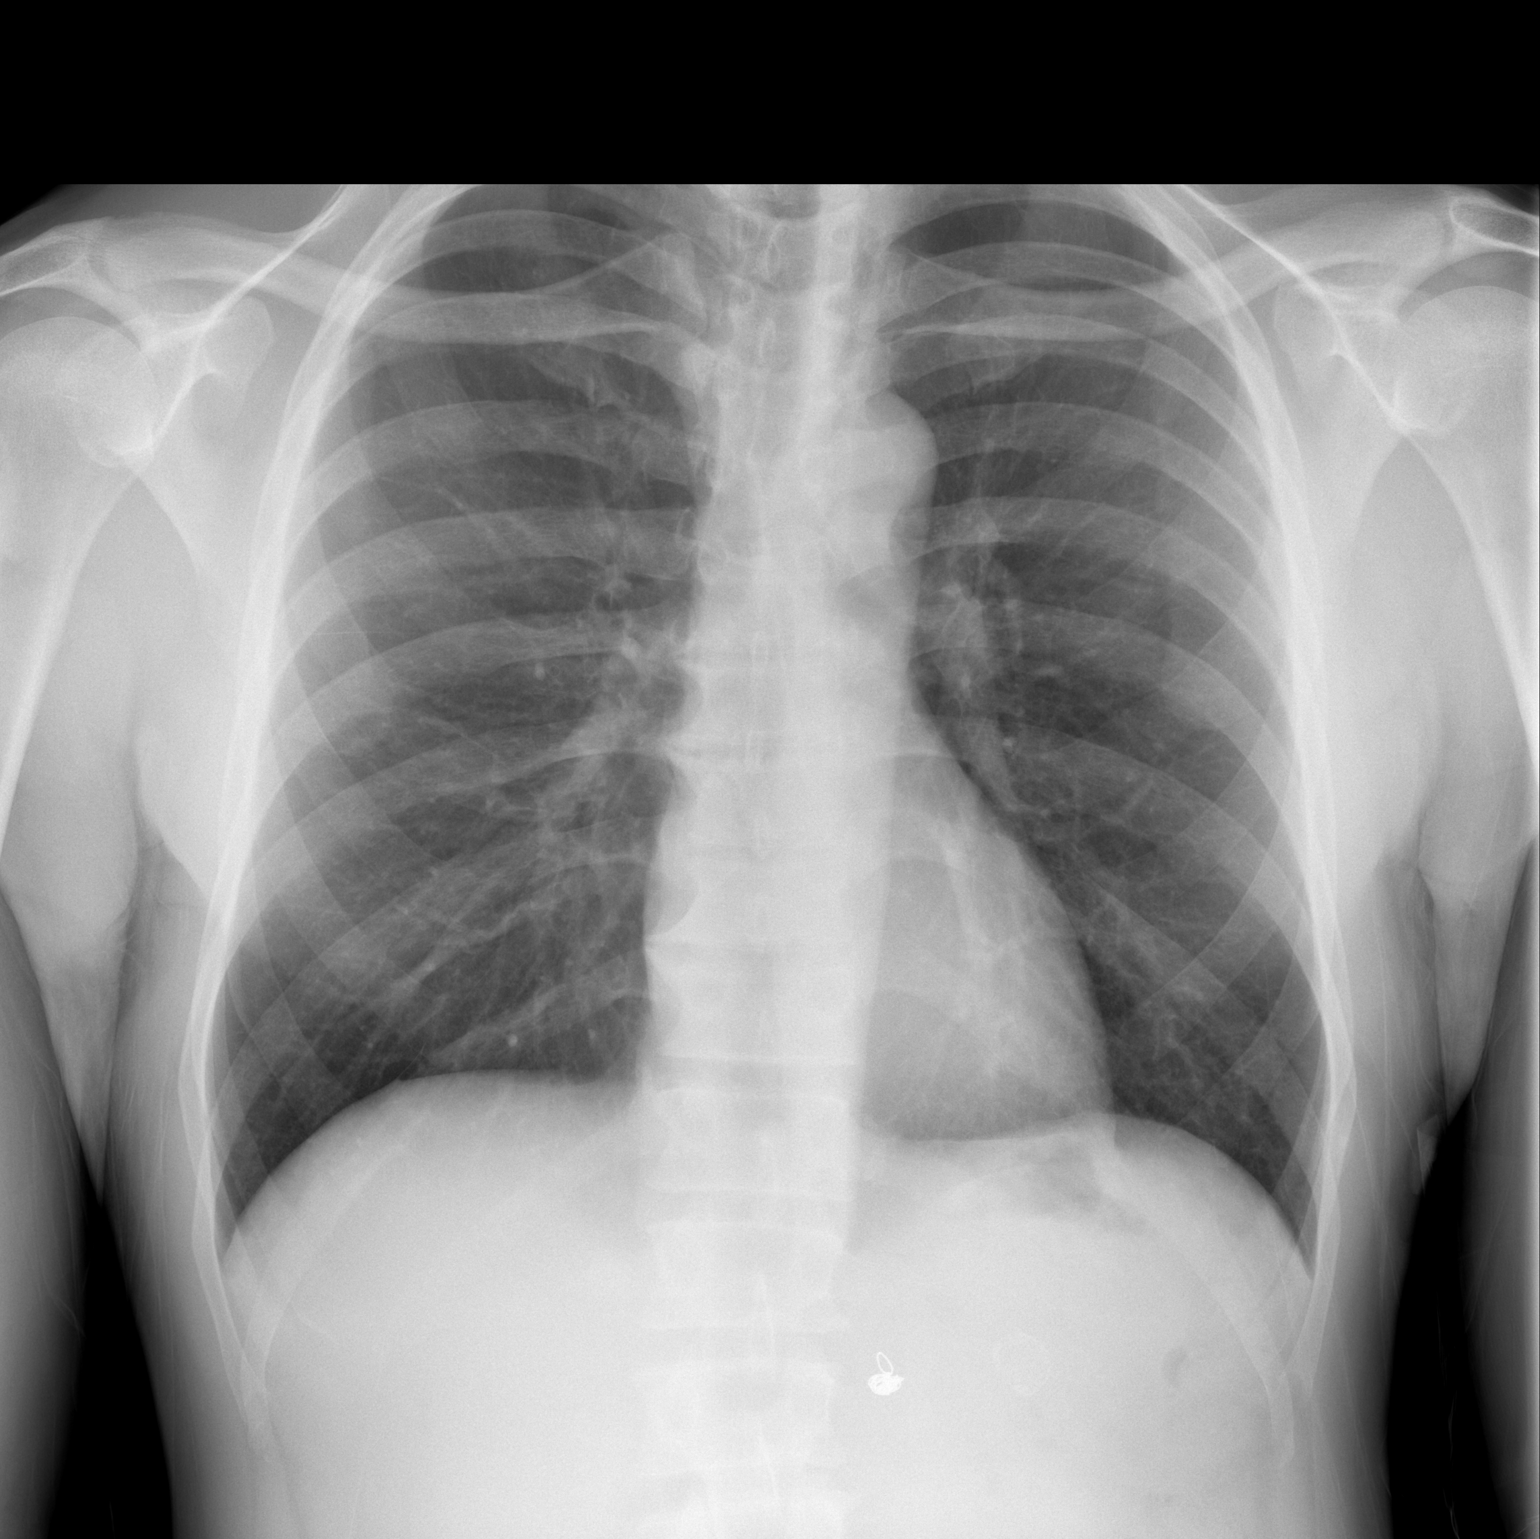

[w chest lat]
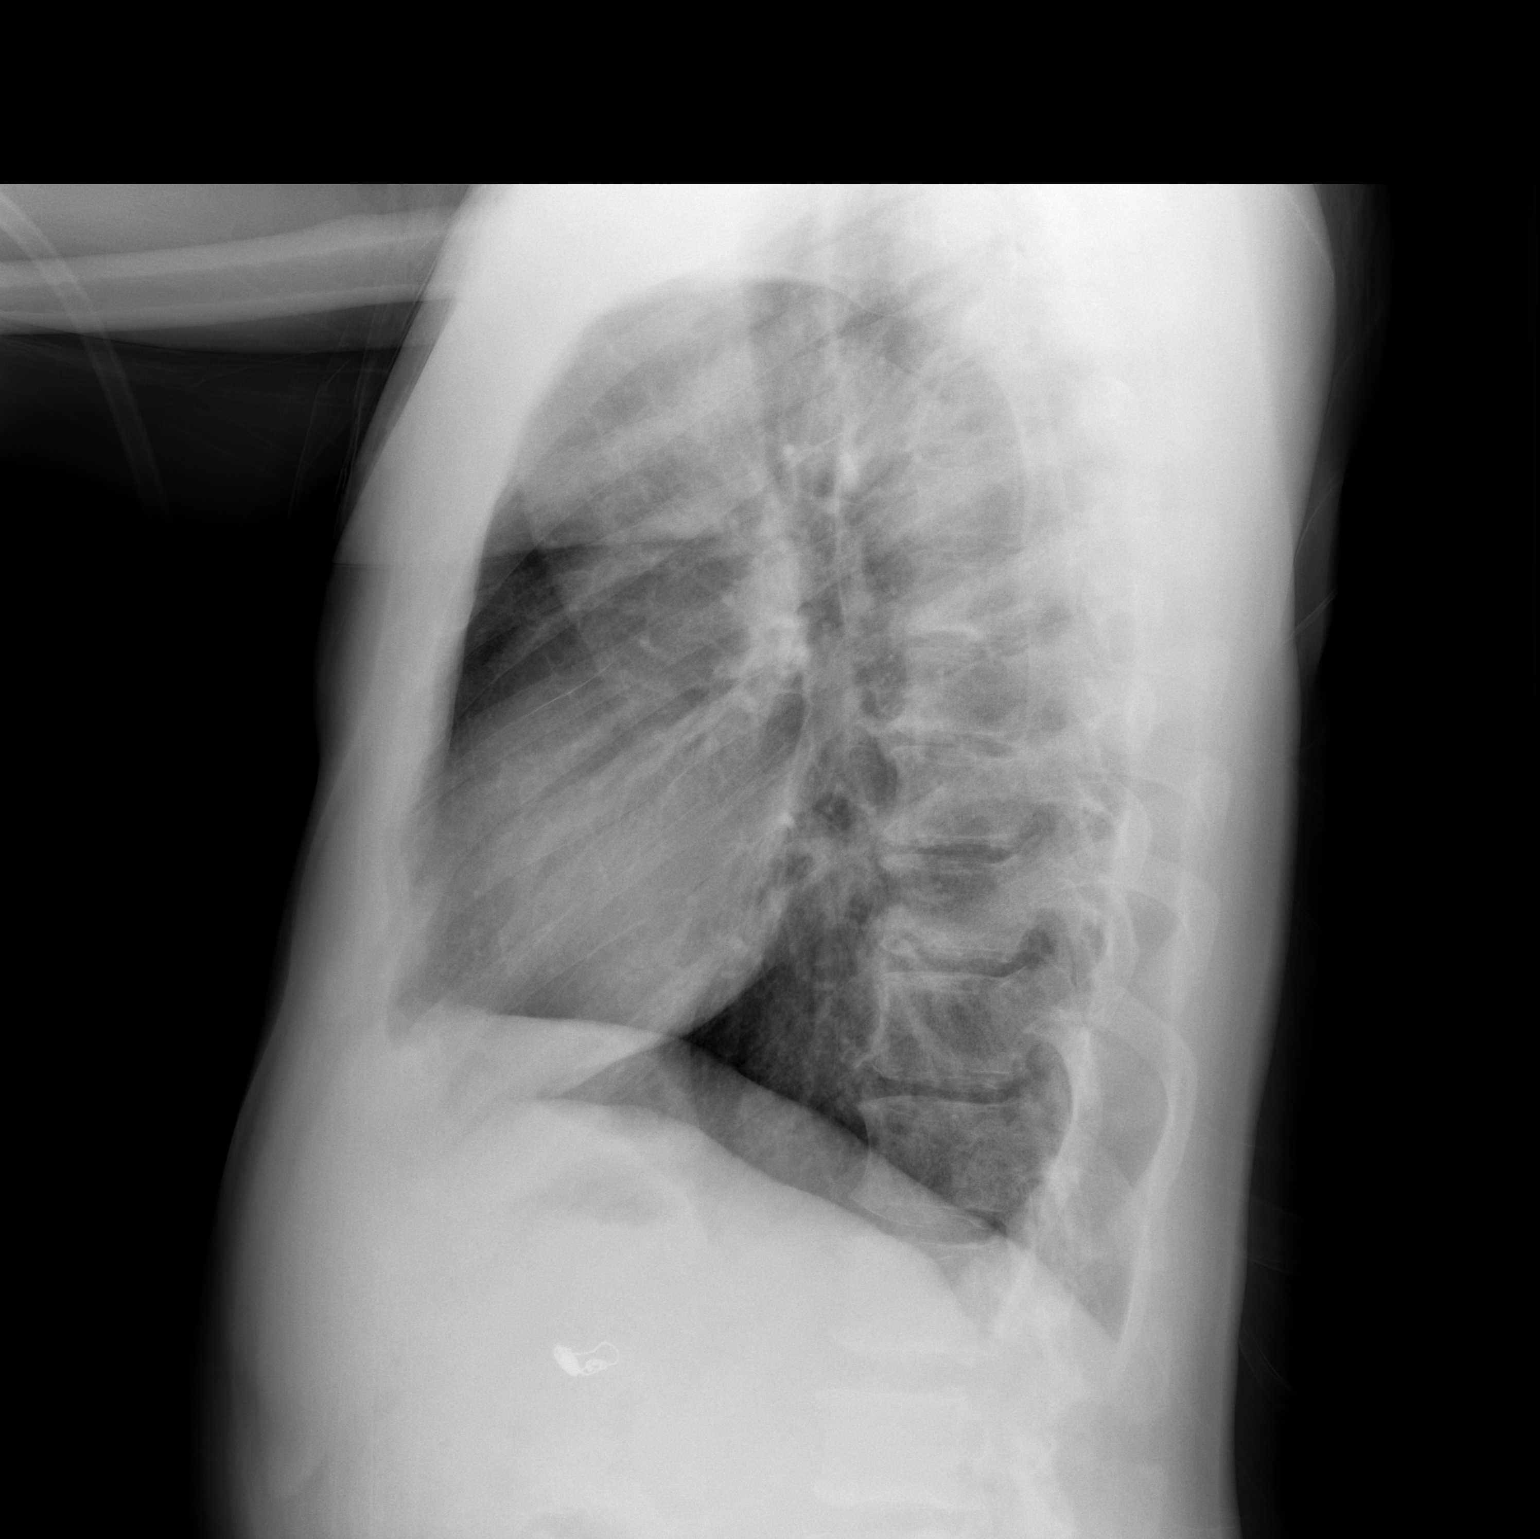

[2 of 2 positions shown; findings below may reference images not displayed]

FINDINGS: Heart size and mediastinal contours are within normal limits. Lungs
are clear. No pleural effusion or pneumothorax is seen. Osseous
structures about the chest are unremarkable.
IMPRESSION: No active disease.  No evidence of pneumonia or pulmonary edema.

## 2022-08-09 ENCOUNTER — Other Ambulatory Visit: Payer: Self-pay

## 2022-08-09 ENCOUNTER — Emergency Department (HOSPITAL_BASED_OUTPATIENT_CLINIC_OR_DEPARTMENT_OTHER)
Admission: EM | Admit: 2022-08-09 | Discharge: 2022-08-09 | Disposition: A | Discharge status: Home / Self Care | Payer: BLUE CROSS/BLUE SHIELD | Attending: Emergency Medicine | Admitting: Emergency Medicine

## 2022-08-09 ENCOUNTER — Encounter (HOSPITAL_BASED_OUTPATIENT_CLINIC_OR_DEPARTMENT_OTHER): Payer: Self-pay | Admitting: Emergency Medicine

## 2022-08-09 DIAGNOSIS — L02211 Cutaneous abscess of abdominal wall: Secondary | ICD-10-CM | POA: Insufficient documentation

## 2022-08-09 DIAGNOSIS — L0291 Cutaneous abscess, unspecified: Secondary | ICD-10-CM

## 2022-08-09 MED ORDER — SULFAMETHOXAZOLE-TRIMETHOPRIM 800-160 MG PO TABS: 1.0000 | ORAL_TABLET | Freq: Two times a day (BID) | ORAL | 0 refills | Status: AC

## 2022-08-09 MED ORDER — SULFAMETHOXAZOLE-TRIMETHOPRIM 800-160 MG PO TABS: 1.0000 | ORAL_TABLET | Freq: Two times a day (BID) | ORAL | 0 refills | Status: DC

## 2022-08-09 NOTE — ED Notes (Signed)
Pt in bed, pt states that he is ready to go home, pt requests a different pharmacy, PA notified, pharmacy changed.  Pt ambulatory from dpt.

## 2022-08-09 NOTE — ED Provider Notes (Signed)
[facility] [place] [facility] Provider Note   CSN: [id number] Arrival date & time: 08/09/22  1823     History  Chief Complaint  Patient presents with   Abscess    Brian Daugherty is a 57 y.o. male.  57 year old male with past medical history presents to the ED with a chief complaint of lower abdominal wall abscess which has been ongoing for the past week and a half.  Patient reports this feels itching, warmth, red to the area.  Reports this came to a head which she tried to bust however not much came out.  He reports now there is redness around the head of the abscess.  Patient has been applying boil cream to it.  No alleviating factors.  He does not have any systemic signs such as fever, nausea, vomiting. No underlying history of diabetes.  The history is provided by the patient.  Abscess Associated symptoms: no fever        Home Medications Prior to Admission medications   Medication Sig Start Date End Date Taking? Authorizing Provider  sulfamethoxazole-trimethoprim (BACTRIM DS) 800-160 MG tablet Take 1 tablet by mouth 2 (two) times daily for 7 days. 08/09/22 08/16/22 Yes Adyline Huberty, PA-C  cephALEXin (KEFLEX) 500 MG capsule Take 1 capsule (500 mg total) by mouth 4 (four) times daily. 03/04/14   Kirichenko, Lahoma Rocker, PA-C  HYDROcodone-acetaminophen (NORCO) 5-325 MG per tablet Take 1 tablet by mouth every 6 (six) hours as needed. 03/04/14   Kirichenko, Lahoma Rocker, PA-C  ibuprofen (ADVIL,MOTRIN) 800 MG tablet Take 1 tablet (800 mg total) by mouth every 8 (eight) hours as needed for mild pain or moderate pain. 03/06/14   Clayton Bibles, PA-C  methocarbamol (ROBAXIN) 500 MG tablet Take 1 tablet (500 mg total) by mouth every 8 (eight) hours as needed for muscle spasms. 07/23/20   Davonna Belling, MD  oxyCODONE-acetaminophen (PERCOCET/ROXICET) 5-325 MG per tablet Take 2 tablets by mouth every 4 (four) hours as needed for severe pain. 03/06/14   Clayton Bibles, PA-C       Allergies    Patient has no known allergies.    Review of Systems   Review of Systems  Constitutional:  Negative for fever.  Skin:  Positive for wound.    Physical Exam Updated Vital Signs BP (!) 158/112 (BP Location: Right Arm)   Pulse 68   Temp 98.5 F (36.9 C) (Oral)   Resp 16   Ht 5' 11"$  (1.803 m)   Wt 79.4 kg   SpO2 98%   BMI 24.41 kg/m  Physical Exam Vitals and nursing note reviewed.  Constitutional:      Appearance: Normal appearance.  HENT:     Head: Normocephalic and atraumatic.  Neurological:     Mental Status: He is alert.     ED Results / Procedures / Treatments   Labs (all labs ordered are listed, but only abnormal results are displayed) Labs Reviewed - No data to display  EKG None  Radiology No results found.  Procedures Procedures    Medications Ordered in ED Medications - No data to display  ED Course/ Medical Decision Making/ A&P                             Medical Decision Making   Patient presents to the ED with a chief complaint of abscess along the lower abdominal wall area that is been going for 1-1/2 weeks, has been putting boil  cream on it but without any improvement in symptoms.  On evaluation there is a quarter size streaking in the skin, erythema and warmth.  No palpable fluctuance, there is a head present which he tried to pop this abscess previously.  He reports not shaving in the area.  Irritated and reports itching along with burning with his shirt.  Concern for cellulitis at this time, we discussed a short course of antibiotics, there is no fluctuance accumulated in order to require I&D at this time.  He does not have a history of MRSA, he is not diabetic.  His vitals are within normal limits aside from elevated high blood pressure and noncompliance of medication.  Patient is hemodynamically stable for discharge.  Return precautions discussed at length.    Portions of this note were generated with Theatre manager. Dictation errors may occur despite best attempts at proofreading.  Final Clinical Impression(s) / ED Diagnoses Final diagnoses:  Abscess    Rx / DC Orders ED Discharge Orders          Ordered    sulfamethoxazole-trimethoprim (BACTRIM DS) 800-160 MG tablet  2 times daily        08/09/22 1849              Janeece Fitting, PA-C 08/09/22 1851    Lennice Sites, DO 08/09/22 1908

## 2022-08-09 NOTE — ED Triage Notes (Signed)
Abscess on lower mid abdomen x 1.5 weeks. Warmth, redness, pain and swelling in area. Denies fevers.

## 2022-08-09 NOTE — Discharge Instructions (Addendum)
I have prescribed antibiotics in room to help treat your skin infection.  Please take 1 tablet twice a day for the next 7 days.  If you experience any fever, nausea, vomiting, chills you will need to return to the emergency department.

## 2022-08-12 ENCOUNTER — Other Ambulatory Visit: Payer: Self-pay

## 2022-08-12 ENCOUNTER — Encounter (HOSPITAL_BASED_OUTPATIENT_CLINIC_OR_DEPARTMENT_OTHER): Payer: Self-pay

## 2022-08-12 ENCOUNTER — Emergency Department (HOSPITAL_BASED_OUTPATIENT_CLINIC_OR_DEPARTMENT_OTHER)
Admission: EM | Admit: 2022-08-12 | Discharge: 2022-08-12 | Disposition: A | Discharge status: Home / Self Care | Payer: BLUE CROSS/BLUE SHIELD | Attending: Emergency Medicine | Admitting: Emergency Medicine

## 2022-08-12 DIAGNOSIS — L02211 Cutaneous abscess of abdominal wall: Secondary | ICD-10-CM | POA: Diagnosis not present

## 2022-08-12 MED ORDER — LIDOCAINE-EPINEPHRINE (PF) 2 %-1:200000 IJ SOLN
10.0000 mL | Freq: Once | INTRAMUSCULAR | Status: AC
  Administered 2022-08-12: 10 mL
  Filled 2022-08-12: qty 20

## 2022-08-12 MED ORDER — OXYCODONE-ACETAMINOPHEN 5-325 MG PO TABS: 1.0000 | ORAL_TABLET | Freq: Four times a day (QID) | ORAL | 0 refills | Status: DC | PRN

## 2022-08-12 NOTE — ED Provider Notes (Signed)
[facility] [place] Provider Note   CSN: [id number] Arrival date & time: 08/12/22  0830     History  Chief Complaint  Patient presents with   Abscess    Brian Daugherty is a 57 y.o. male.  Patient presents to the emergency department today for evaluation of abdominal wall abscess.  This has been present for 1 to 2 weeks.  Patient has had increasing pain.  He was seen in the emergency department previously prescribed a course of Bactrim which she has been taking.  He feels that the area has enlarged over the past 1 day.  No fevers.  No nausea, vomiting, or diarrhea.  He has never had a boil before.       Home Medications Prior to Admission medications   Medication Sig Start Date End Date Taking? Authorizing Provider  oxyCODONE-acetaminophen (PERCOCET/ROXICET) 5-325 MG tablet Take 1 tablet by mouth every 6 (six) hours as needed for severe pain. 08/12/22  Yes Carlisle Cater, PA-C  sulfamethoxazole-trimethoprim (BACTRIM DS) 800-160 MG tablet Take 1 tablet by mouth 2 (two) times daily for 7 days. 08/09/22 08/16/22  Janeece Fitting, PA-C      Allergies    Patient has no known allergies.    Review of Systems   Review of Systems  Physical Exam Updated Vital Signs BP 134/88 (BP Location: Right Arm)   Pulse 70   Temp 98.1 F (36.7 C) (Oral)   Resp 18   Ht 5' 11"$  (1.803 m)   Wt 79.4 kg   SpO2 98%   BMI 24.41 kg/m  Physical Exam Vitals and nursing note reviewed.  Constitutional:      Appearance: He is well-developed.  HENT:     Head: Normocephalic and atraumatic.  Eyes:     Conjunctiva/sclera: Conjunctivae normal.  Pulmonary:     Effort: No respiratory distress.  Musculoskeletal:     Cervical back: Normal range of motion and neck supple.  Skin:    General: Skin is warm and dry.     Comments: Abdominal wall abscess noted, midline.  There is approximately 5 cm x 3 cm area of induration.  On the right side it is coming to ahead but not  spontaneously draining.  Area is fluctuant.  Area is tender.  Neurological:     Mental Status: He is alert.     ED Results / Procedures / Treatments   Labs (all labs ordered are listed, but only abnormal results are displayed) Labs Reviewed - No data to display  EKG None  Radiology No results found.  Procedures .Marland KitchenIncision and Drainage  Date/Time: 08/12/2022 10:09 AM  Performed by: Carlisle Cater, PA-C Authorized by: Carlisle Cater, PA-C   Consent:    Consent obtained:  Verbal   Consent given by:  Patient   Risks discussed:  Bleeding, pain and damage to other organs   Alternatives discussed:  Observation Universal protocol:    Patient identity confirmed:  Verbally with patient and provided demographic data Location:    Type:  Abscess   Size:  5cm   Location:  Trunk   Trunk location:  Abdomen Pre-procedure details:    Skin preparation:  Povidone-iodine Anesthesia:    Anesthesia method:  Local infiltration   Local anesthetic:  Lidocaine 2% WITH epi Procedure type:    Complexity:  Simple Procedure details:    Incision types:  Stab incision   Wound management:  Probed and deloculated   Drainage:  Purulent   Drainage amount:  Moderate   Wound treatment:  Drain placed   Packing materials:  1/4 in iodoform gauze Post-procedure details:    Procedure completion:  Tolerated well, no immediate complications     Medications Ordered in ED Medications  lidocaine-EPINEPHrine (XYLOCAINE W/EPI) 2 %-1:200000 (PF) injection 10 mL (10 mLs Infiltration Given by Other 08/12/22 [id number])    ED Course/ Medical Decision Making/ A&P    Patient seen and examined. History obtained directly from patient.  Reviewed previous ED visit.  Labs/EKG: None ordered  Imaging: None ordered  Medications/Fluids: Ordered: Lidocaine 2% with epinephrine.   Most recent vital signs reviewed and are as follows: BP 134/88 (BP Location: Right Arm)   Pulse 70   Temp 98.1 F (36.7 C) (Oral)   Resp  18   Ht 5' 11"$  (1.803 m)   Wt 79.4 kg   SpO2 98%   BMI 24.41 kg/m   Initial impression: Abdominal wall abscess.  Discussed I&D procedure with patient and he agrees to proceed.  10:07 AM Reassessment performed. Patient appears stable.  Pain is improved.  Tolerated procedure well.  No complications except for some minor bleeding controlled with pressure.  Plan: Discharge to home.   Prescriptions written for: Percocet # 5 tablets  Other home care instructions discussed: The patient was urged to return to the Emergency Department urgently with worsening pain, swelling, expanding erythema especially if it streaks away from the affected area, fever, or if they have any other concerns.   The patient was instructed to remove packing at home in 24 hours and return to the Emergency Department or go to their PCP for recheck if symptoms not improving in 48 hours.   The patient verbalized understanding and stated agreement with this plan.                              Medical Decision Making Risk Prescription drug management.   Patient with abdominal wall abscess, I&D performed as above.  No signs of sepsis.  No signs of necrotizing infection or other complication.        Final Clinical Impression(s) / ED Diagnoses Final diagnoses:  Abdominal wall abscess    Rx / DC Orders ED Discharge Orders          Ordered    oxyCODONE-acetaminophen (PERCOCET/ROXICET) 5-325 MG tablet  Every 6 hours PRN        08/12/22 1004              Carlisle Cater, PA-C 08/12/22 1010    Fredia Sorrow, MD 08/14/22 1417

## 2022-08-12 NOTE — Discharge Instructions (Signed)
Please read and follow all provided instructions.  Your diagnoses today include:  1. Abdominal wall abscess     Tests performed today include: Vital signs. See below for your results today.   Medications prescribed:  Percocet (oxycodone/acetaminophen) - narcotic pain medication  DO NOT drive or perform any activities that require you to be awake and alert because this medicine can make you drowsy. BE VERY CAREFUL not to take multiple medicines containing Tylenol (also called acetaminophen). Doing so can lead to an overdose which can damage your liver and cause liver failure and possibly death.  Take any prescribed medications only as directed.   Home care instructions:  You may remove the packing yourself at home in 24 to 48 hours if it does not fall out spontaneously Complete course of previously prescribed antibiotics Follow any educational materials contained in this packet  Follow-up instructions: Return to the Emergency Department in 48 hours for a recheck if your symptoms are not significantly improved.  Return instructions:  Return to the Emergency Department if you have: Fever Worsening symptoms Worsening pain Worsening swelling Redness of the skin that moves away from the affected area, especially if it streaks away from the affected area  Any other emergent concerns  Your vital signs today were: BP 134/88 (BP Location: Right Arm)   Pulse 70   Temp 98.1 F (36.7 C) (Oral)   Resp 18   Ht 5' 11"$  (1.803 m)   Wt 79.4 kg   SpO2 98%   BMI 24.41 kg/m  If your blood pressure (BP) was elevated above 135/85 this visit, please have this repeated by your doctor within one month. --------------

## 2022-08-12 NOTE — ED Triage Notes (Signed)
Abscess to lower mid abdomen x 2 weeks. States was here on 2/16, prescribed antibiotics. States has gotten bigger and more painful.

## 2022-11-01 IMAGING — CR DG ELBOW COMPLETE 3+V*L*
4 series · 4 of 4 positions shown · non-contrast
Comparison: None.

CLINICAL DATA: Left elbow pain for 2 weeks

EXAM:
LEFT ELBOW - COMPLETE 3+ VIEW

[x elbow joint ap left]
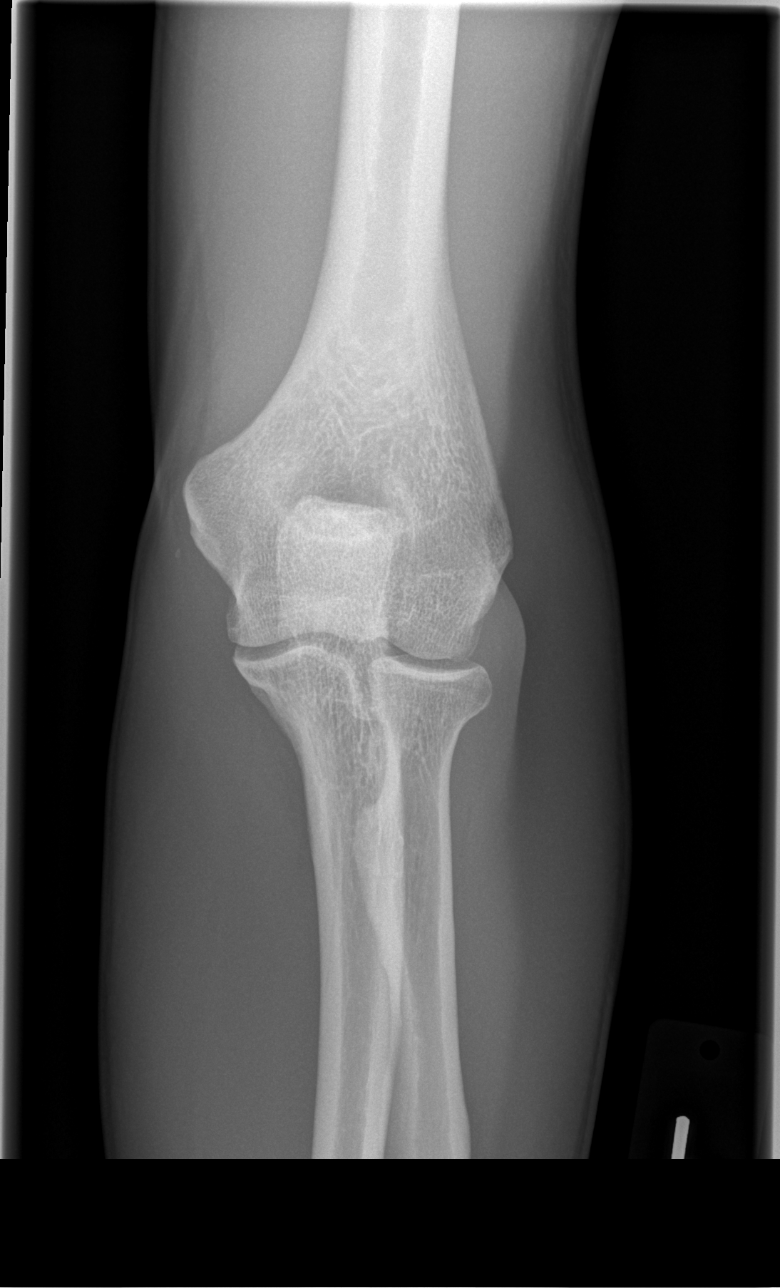

[x elbow joint obl. left (1 of 2)]
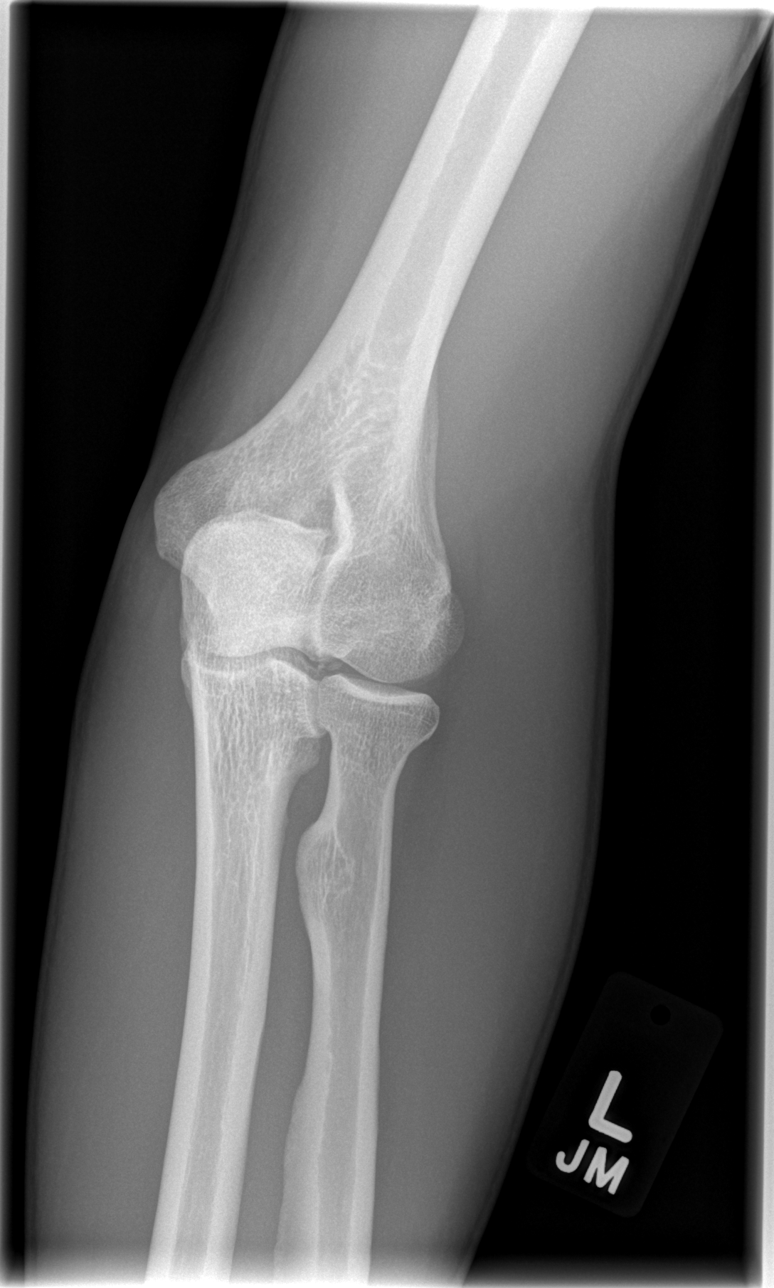

[x elbow joint obl. left (2 of 2)]
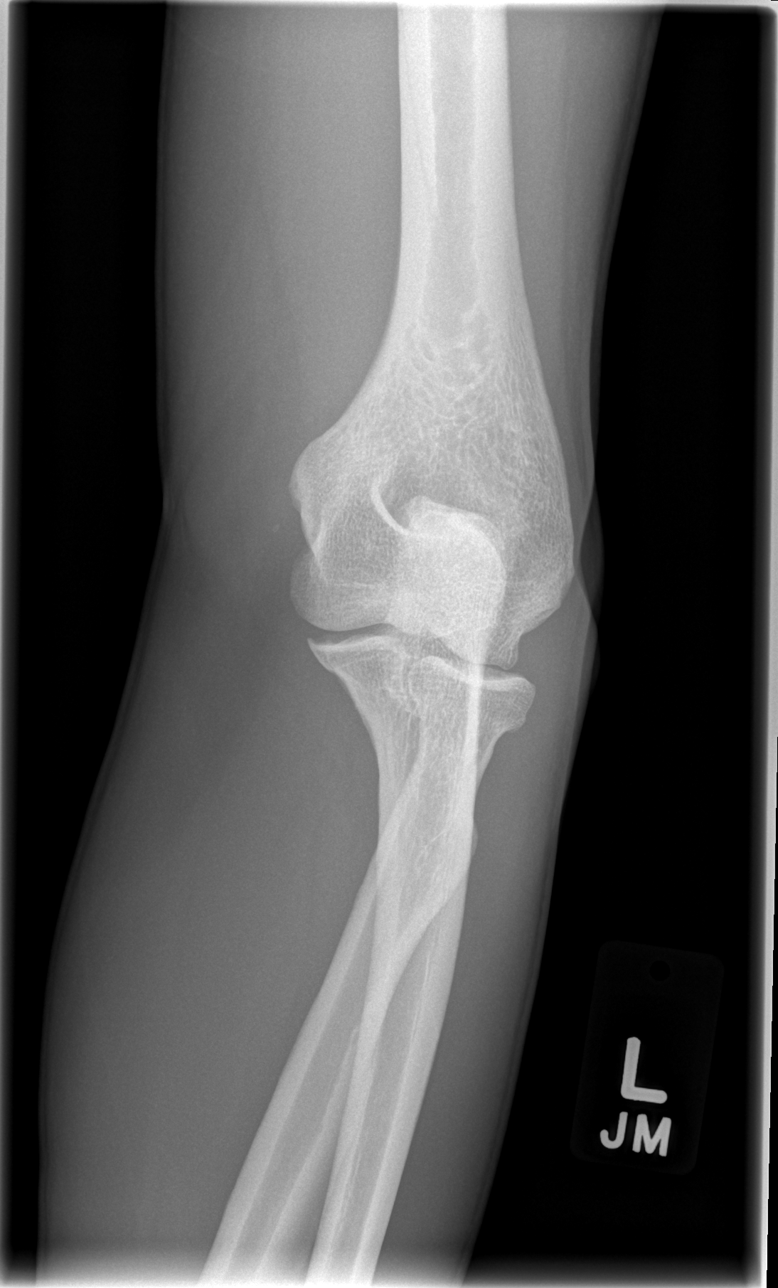

[x elbow joint lat left]
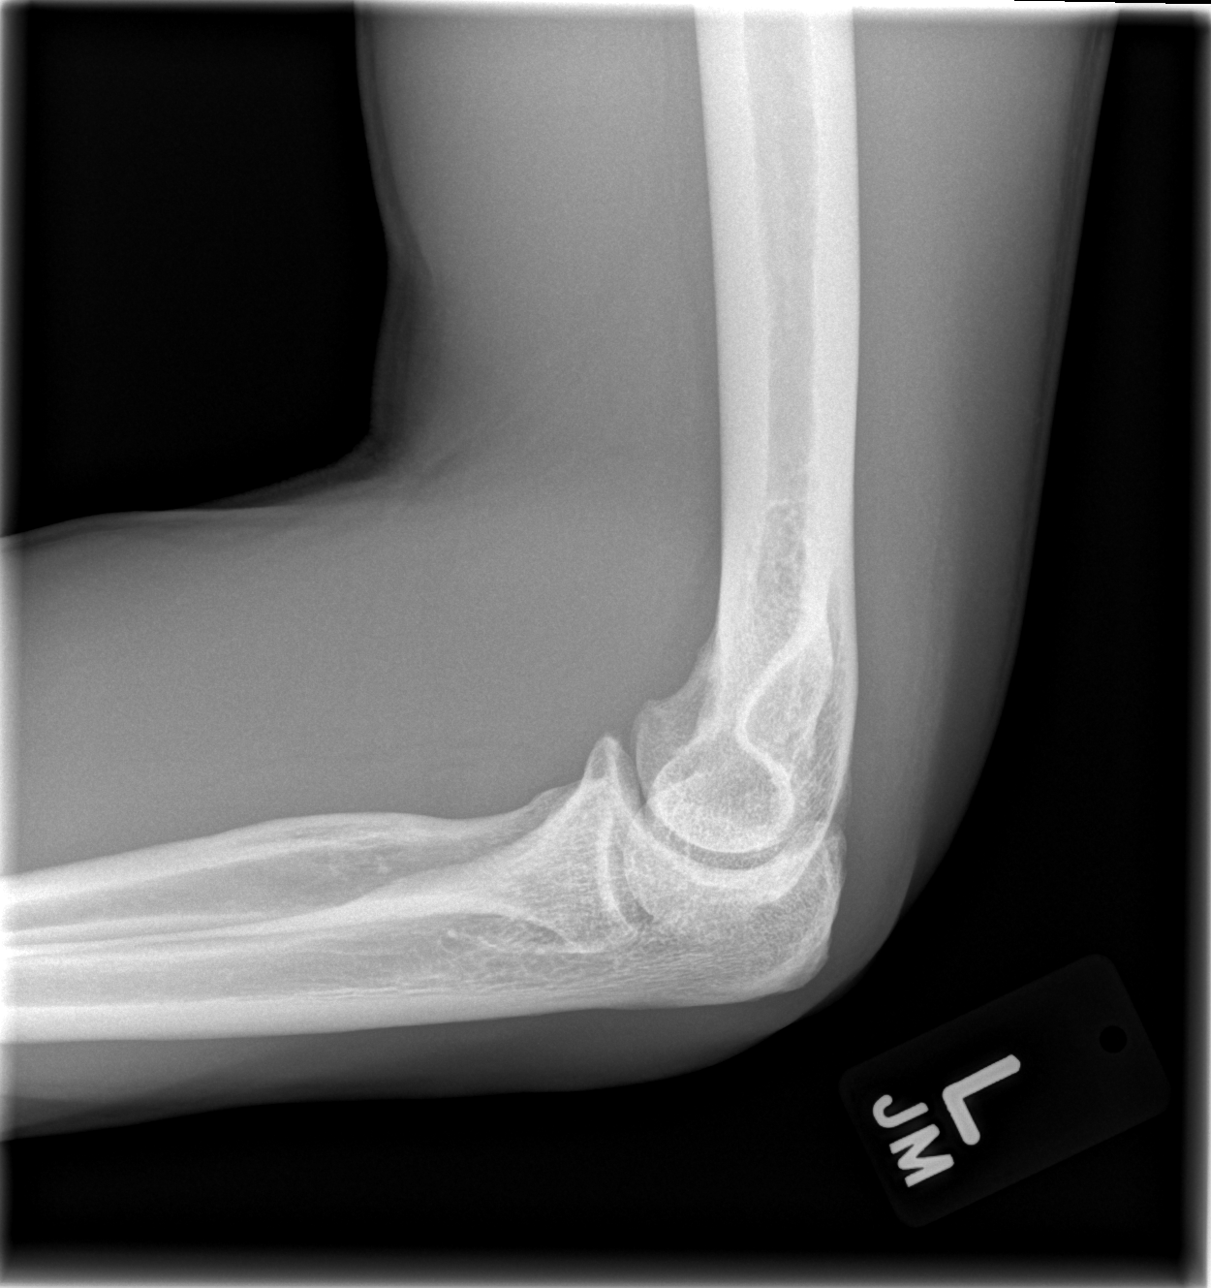

[4 of 4 positions shown; findings below may reference images not displayed]

FINDINGS: There is no acute fracture or dislocation. Elbow alignment is
normal. The joint spaces are preserved. The soft tissues are normal.
There is no effusion.
IMPRESSION: Unremarkable elbow radiographs.

## 2022-12-14 ENCOUNTER — Emergency Department (EMERGENCY_DEPARTMENT_HOSPITAL)
Admission: EM | Admit: 2022-12-14 | Discharge: 2022-12-15 | Disposition: A | Discharge status: Home / Self Care | Payer: BLUE CROSS/BLUE SHIELD | Source: Home / Self Care | Attending: Emergency Medicine | Admitting: Emergency Medicine

## 2022-12-14 ENCOUNTER — Other Ambulatory Visit: Payer: Self-pay

## 2022-12-14 DIAGNOSIS — T675XXA Heat exhaustion, unspecified, initial encounter: Secondary | ICD-10-CM | POA: Insufficient documentation

## 2022-12-14 DIAGNOSIS — F322 Major depressive disorder, single episode, severe without psychotic features: Secondary | ICD-10-CM

## 2022-12-14 DIAGNOSIS — M62838 Other muscle spasm: Secondary | ICD-10-CM | POA: Insufficient documentation

## 2022-12-14 DIAGNOSIS — R45851 Suicidal ideations: Secondary | ICD-10-CM | POA: Insufficient documentation

## 2022-12-14 DIAGNOSIS — F1414 Cocaine abuse with cocaine-induced mood disorder: Secondary | ICD-10-CM | POA: Insufficient documentation

## 2022-12-14 DIAGNOSIS — T679XXA Effect of heat and light, unspecified, initial encounter: Secondary | ICD-10-CM

## 2022-12-14 DIAGNOSIS — F1494 Cocaine use, unspecified with cocaine-induced mood disorder: Secondary | ICD-10-CM | POA: Diagnosis not present

## 2022-12-14 NOTE — ED Triage Notes (Signed)
Pt has been out in the heat today and was waiting for a bus tonight when he started having muscle cramps in both thighs. A&O x4. No other complaints.  Ems had a high heart rate and low bp so they gave 500 cc of fluid

## 2022-12-15 ENCOUNTER — Inpatient Hospital Stay (HOSPITAL_COMMUNITY)
Admission: AD | Admit: 2022-12-15 | Discharge: 2022-12-19 | DRG: 885 | Disposition: A | Discharge status: Home / Self Care | Payer: BLUE CROSS/BLUE SHIELD | Source: Intra-hospital | Attending: Psychiatry | Admitting: Psychiatry

## 2022-12-15 ENCOUNTER — Encounter (HOSPITAL_COMMUNITY): Payer: Self-pay | Admitting: Nurse Practitioner

## 2022-12-15 DIAGNOSIS — Z634 Disappearance and death of family member: Secondary | ICD-10-CM

## 2022-12-15 DIAGNOSIS — Z5900 Homelessness unspecified: Secondary | ICD-10-CM | POA: Diagnosis not present

## 2022-12-15 DIAGNOSIS — R45851 Suicidal ideations: Secondary | ICD-10-CM

## 2022-12-15 DIAGNOSIS — Z79899 Other long term (current) drug therapy: Secondary | ICD-10-CM

## 2022-12-15 DIAGNOSIS — E785 Hyperlipidemia, unspecified: Secondary | ICD-10-CM | POA: Diagnosis present

## 2022-12-15 DIAGNOSIS — T675XXA Heat exhaustion, unspecified, initial encounter: Secondary | ICD-10-CM | POA: Diagnosis present

## 2022-12-15 DIAGNOSIS — Z560 Unemployment, unspecified: Secondary | ICD-10-CM

## 2022-12-15 DIAGNOSIS — E860 Dehydration: Secondary | ICD-10-CM | POA: Diagnosis present

## 2022-12-15 DIAGNOSIS — Z5982 Transportation insecurity: Secondary | ICD-10-CM

## 2022-12-15 DIAGNOSIS — I10 Essential (primary) hypertension: Secondary | ICD-10-CM | POA: Diagnosis present

## 2022-12-15 DIAGNOSIS — F1494 Cocaine use, unspecified with cocaine-induced mood disorder: Secondary | ICD-10-CM | POA: Diagnosis present

## 2022-12-15 DIAGNOSIS — M62838 Other muscle spasm: Secondary | ICD-10-CM | POA: Diagnosis present

## 2022-12-15 DIAGNOSIS — H409 Unspecified glaucoma: Secondary | ICD-10-CM | POA: Diagnosis present

## 2022-12-15 DIAGNOSIS — F322 Major depressive disorder, single episode, severe without psychotic features: Secondary | ICD-10-CM

## 2022-12-15 DIAGNOSIS — F1590 Other stimulant use, unspecified, uncomplicated: Secondary | ICD-10-CM | POA: Insufficient documentation

## 2022-12-15 DIAGNOSIS — G4700 Insomnia, unspecified: Secondary | ICD-10-CM | POA: Diagnosis present

## 2022-12-15 DIAGNOSIS — F1510 Other stimulant abuse, uncomplicated: Secondary | ICD-10-CM | POA: Diagnosis present

## 2022-12-15 DIAGNOSIS — F419 Anxiety disorder, unspecified: Secondary | ICD-10-CM | POA: Diagnosis present

## 2022-12-15 DIAGNOSIS — G8929 Other chronic pain: Secondary | ICD-10-CM | POA: Diagnosis present

## 2022-12-15 DIAGNOSIS — Z5941 Food insecurity: Secondary | ICD-10-CM | POA: Diagnosis not present

## 2022-12-15 DIAGNOSIS — F17210 Nicotine dependence, cigarettes, uncomplicated: Secondary | ICD-10-CM | POA: Diagnosis present

## 2022-12-15 LAB — COMPREHENSIVE METABOLIC PANEL
ALT: 28 U/L (ref 0–44)
AST: 45 U/L — ABNORMAL HIGH (ref 15–41)
Albumin: 3.7 g/dL (ref 3.5–5.0)
Alkaline Phosphatase: 51 U/L (ref 38–126)
Anion gap: 8 (ref 5–15)
BUN: 22 mg/dL — ABNORMAL HIGH (ref 6–20)
CO2: 22 mmol/L (ref 22–32)
Calcium: 8.7 mg/dL — ABNORMAL LOW (ref 8.9–10.3)
Chloride: 110 mmol/L (ref 98–111)
Creatinine, Ser: 1.06 mg/dL (ref 0.61–1.24)
GFR, Estimated: >60 mL/min (ref >60)
Glucose, Bld: 121 mg/dL — ABNORMAL HIGH (ref 70–99)
Potassium: 3.5 mmol/L (ref 3.5–5.1)
Sodium: 140 mmol/L (ref 135–145)
Total Bilirubin: 0.8 mg/dL (ref 0.3–1.2)
Total Protein: 6.9 g/dL (ref 6.5–8.1)

## 2022-12-15 LAB — CBC WITH DIFFERENTIAL/PLATELET
Abs Immature Granulocytes: 0.01 10*3/uL (ref 0.00–0.07)
Basophils Absolute: 0 10*3/uL (ref 0.0–0.1)
Basophils Relative: 1 %
Eosinophils Absolute: 0.1 10*3/uL (ref 0.0–0.5)
Eosinophils Relative: 2 %
HCT: 42.7 % (ref 39.0–52.0)
Hemoglobin: 14.2 g/dL (ref 13.0–17.0)
Immature Granulocytes: 0 %
Lymphocytes Relative: 24 %
Lymphs Abs: 1.5 10*3/uL (ref 0.7–4.0)
MCH: 27.1 pg (ref 26.0–34.0)
MCHC: 33.3 g/dL (ref 30.0–36.0)
MCV: 81.5 fL (ref 80.0–100.0)
Monocytes Absolute: 0.6 10*3/uL (ref 0.1–1.0)
Monocytes Relative: 9 %
Neutro Abs: 4 10*3/uL (ref 1.7–7.7)
Neutrophils Relative %: 64 %
Platelets: 241 10*3/uL (ref 150–400)
RBC: 5.24 MIL/uL (ref 4.22–5.81)
RDW: 14.5 % (ref 11.5–15.5)
WBC: 6.2 10*3/uL (ref 4.0–10.5)
nRBC: 0 % (ref 0.0–0.2)

## 2022-12-15 LAB — SALICYLATE LEVEL: Salicylate Lvl: <7 mg/dL — ABNORMAL LOW (ref 7.0–30.0)

## 2022-12-15 LAB — ACETAMINOPHEN LEVEL: Acetaminophen (Tylenol), Serum: <10 ug/mL — ABNORMAL LOW (ref 10–30)

## 2022-12-15 LAB — ETHANOL: Alcohol, Ethyl (B): <10 mg/dL (ref <10)

## 2022-12-15 MED ORDER — HALOPERIDOL 5 MG PO TABS: 5.0000 mg | ORAL_TABLET | Freq: Three times a day (TID) | ORAL | Status: DC | PRN

## 2022-12-15 MED ORDER — SODIUM CHLORIDE 0.9 % IV BOLUS
1000.0000 mL | Freq: Once | INTRAVENOUS | Status: AC
Start: 2022-12-15 — End: 2022-12-15
  Administered 2022-12-15: 1000 mL via INTRAVENOUS

## 2022-12-15 MED ORDER — LORAZEPAM 2 MG/ML IJ SOLN: 2.0000 mg | Freq: Three times a day (TID) | INTRAMUSCULAR | Status: DC | PRN

## 2022-12-15 MED ORDER — TRAZODONE HCL 50 MG PO TABS
50.0000 mg | ORAL_TABLET | Freq: Every evening | ORAL | Status: DC | PRN
  Administered 2022-12-18: 50 mg via ORAL
  Filled 2022-12-15: qty 1

## 2022-12-15 MED ORDER — HYDROXYZINE HCL 25 MG PO TABS: 25.0000 mg | ORAL_TABLET | Freq: Three times a day (TID) | ORAL | Status: DC | PRN

## 2022-12-15 MED ORDER — ACETAMINOPHEN 325 MG PO TABS
650.0000 mg | ORAL_TABLET | Freq: Four times a day (QID) | ORAL | Status: DC | PRN
  Administered 2022-12-17 – 2022-12-18 (×3): 650 mg via ORAL
  Filled 2022-12-15 (×3): qty 2

## 2022-12-15 MED ORDER — HALOPERIDOL LACTATE 5 MG/ML IJ SOLN: 5.0000 mg | Freq: Three times a day (TID) | INTRAMUSCULAR | Status: DC | PRN

## 2022-12-15 MED ORDER — LORAZEPAM 1 MG PO TABS: 2.0000 mg | ORAL_TABLET | Freq: Three times a day (TID) | ORAL | Status: DC | PRN

## 2022-12-15 MED ORDER — MAGNESIUM HYDROXIDE 400 MG/5ML PO SUSP: 30.0000 mL | Freq: Every day | ORAL | Status: DC | PRN

## 2022-12-15 MED ORDER — DIPHENHYDRAMINE HCL 50 MG/ML IJ SOLN: 50.0000 mg | Freq: Three times a day (TID) | INTRAMUSCULAR | Status: DC | PRN

## 2022-12-15 MED ORDER — DIPHENHYDRAMINE HCL 25 MG PO CAPS: 50.0000 mg | ORAL_CAPSULE | Freq: Three times a day (TID) | ORAL | Status: DC | PRN

## 2022-12-15 MED ORDER — ALUM & MAG HYDROXIDE-SIMETH 200-200-20 MG/5ML PO SUSP: 30.0000 mL | ORAL | Status: DC | PRN

## 2022-12-15 NOTE — ED Notes (Signed)
Pt is aware of UA need  

## 2022-12-15 NOTE — ED Notes (Signed)
Pt present to room 51, steady gait noted.

## 2022-12-15 NOTE — Consult Note (Signed)
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 male patient with no previous psychiatric history who originally presented to [facility] with complaints of muscle cramps, homelessness, and dehydration. After being medically cleared and having discharge paperwork reviewed with him, he became tearful, expressed his cocaine use and suicidal ideations with a plan to cut his wrists.   TTS was consulted.   Subjective:   Pt seen this morning at [facility] for face to face psychiatric evaluation. Upon assessment pt is sitting in his bed, cooperative and pleasant. He confirms he has no previous psychiatric history, but has struggled with substance abuse for "decades." Pt states his primary concern is cocaine. He occasionally will drink alcohol, but  mostly uses cocaine daily. Pt stated he was previously sober for around 5 years, had a job, and his own apartment. However, after he relapsed around a couple months ago he lost his job and apartment, and has now been newly homeless for around 2 months. Pt reported not having any family or friends as support system. States being newly homeless has caused him a lot of depression and self esteem issues. Pt stated today, he is ready to kill himself. He can not identify any reason to live. Has no identifiable safety factors. He endorses plan to take razors or knife to his wrists and bleed to death. He has no previous suicide attempt history. He denies any HI. Denies AVH.   Pt is hoping to get substance abuse treatment and resources  support out of this hospitalization. Due to him being unable to contract for safety and actively endorsing suicidal ideations, will recommend inpatient psychiatric treatment. Pt is agreeable with this. Pt declines wanting to start any psychotropic medications at this time.   Past Psychiatric History:  Polysubstance abuse, no other previous diagnosis  Risk to Self or Others: Is the patient at risk to self? Yes Has the patient been a risk to self in the past 6 months? No Has the patient been a risk to self within the distant past? No Is the patient a risk to others? No Has the patient been a risk to others in the past 6 months? No Has the patient been a risk to others within the distant past? No  [place] Scale:  Flowsheet Row ED from 12/14/2022 in [facility] Emergency Department at [facility] ED from 08/09/2022 in [facility] Emergency Department at [facility] ED from 01/17/2022 in [facility] Emergency Department at [facility]  C-SSRS RISK CATEGORY Low Risk No Risk No Risk       Past Medical History:  Past Medical History:  Diagnosis Date   Glaucoma    Hyperlipemia    Hypertension     Past Surgical History:  Procedure Laterality Date   LEG SURGERY     LUNG SURGERY     Family History: No family history on file. Social History:  Social History   Substance and Sexual Activity  Alcohol Use No     Social History   Substance and Sexual Activity  Drug Use No  Social History   Socioeconomic History   Marital status: Legally Separated    Spouse name: Not on file   Number of children: Not on file   Years of education: Not on file   Highest education level: Not on file  Occupational History   Not on file  Tobacco Use   Smoking status: Every Day    Packs/day: .5    Types: Cigarettes   Smokeless tobacco: Never  Substance and Sexual Activity   Alcohol use: No   Drug use: No   Sexual activity: Not on file  Other Topics Concern   Not on  file  Social History Narrative   Not on file   Social Determinants of Health   Financial Resource Strain: Not on file  Food Insecurity: Not on file  Transportation Needs: Not on file  Physical Activity: Not on file  Stress: Not on file  Social Connections: Not on file    Allergies:  No Known Allergies  Labs:  Results for orders placed or performed during the hospital encounter of 12/14/22 (from the past 48 hour(s))  CBC with Differential     Status: None   Collection Time: 12/15/22 12:10 AM  Result Value Ref Range   WBC 6.2 4.0 - 10.5 K/uL   RBC 5.24 4.22 - 5.81 MIL/uL   Hemoglobin 14.2 13.0 - 17.0 g/dL   HCT 16.1 09.6 - 04.5 %   MCV 81.5 80.0 - 100.0 fL   MCH 27.1 26.0 - 34.0 pg   MCHC 33.3 30.0 - 36.0 g/dL   RDW 40.9 81.1 - 91.4 %   Platelets 241 150 - 400 K/uL   nRBC 0.0 0.0 - 0.2 %   Neutrophils Relative % 64 %   Neutro Abs 4.0 1.7 - 7.7 K/uL   Lymphocytes Relative 24 %   Lymphs Abs 1.5 0.7 - 4.0 K/uL   Monocytes Relative 9 %   Monocytes Absolute 0.6 0.1 - 1.0 K/uL   Eosinophils Relative 2 %   Eosinophils Absolute 0.1 0.0 - 0.5 K/uL   Basophils Relative 1 %   Basophils Absolute 0.0 0.0 - 0.1 K/uL   Immature Granulocytes 0 %   Abs Immature Granulocytes 0.01 0.00 - 0.07 K/uL    Comment: Performed at [facility] Lab, [address], [place], [place] [address]  Comprehensive metabolic panel     Status: Abnormal   Collection Time: 12/15/22  1:20 AM  Result Value Ref Range   Sodium 140 135 - 145 mmol/L   Potassium 3.5 3.5 - 5.1 mmol/L   Chloride 110 98 - 111 mmol/L   CO2 22 22 - 32 mmol/L   Glucose, Bld 121 (H) 70 - 99 mg/dL    Comment: Glucose reference range applies only to samples taken after fasting for at least 8 hours.   BUN 22 (H) 6 - 20 mg/dL   Creatinine, Ser 6.21 0.61 - 1.24 mg/dL   Calcium 8.7 (L) 8.9 - 10.3 mg/dL   Total Protein 6.9 6.5 - 8.1 g/dL   Albumin 3.7 3.5 - 5.0 g/dL   AST 45 (H) 15 - 41 U/L   ALT 28 0 - 44 U/L   Alkaline Phosphatase  51 38 - 126 U/L   Total Bilirubin 0.8 0.3 - 1.2 mg/dL   GFR, Estimated >30 >86 mL/min    Comment: (NOTE) Calculated using the CKD-EPI Creatinine Equation (2021)    Anion gap 8 5 - 15    Comment: Performed at [facility], [address], [place], [place] [address]  Acetaminophen level     Status: Abnormal   Collection Time: 12/15/22  9:15 AM  Result Value Ref Range   Acetaminophen (Tylenol), Serum <10 (L) 10 - 30 ug/mL    Comment: (NOTE) Therapeutic concentrations vary significantly. A range of 10-30 ug/mL  may be an effective concentration for many patients. However, some  are best treated at concentrations outside of this range. Acetaminophen concentrations >150 ug/mL at 4 hours after ingestion  and >50 ug/mL at 12 hours after ingestion are often associated with  toxic reactions.  Performed at [facility], [address], [place], [place] [address]   Ethanol     Status: None   Collection Time: 12/15/22  9:15 AM  Result Value Ref Range   Alcohol, Ethyl (B) <10 <10 mg/dL    Comment: (NOTE) Lowest detectable limit for serum alcohol is 10 mg/dL.  For medical purposes only. Performed at [facility], [address], [place], [place] [address]   Salicylate level     Status: Abnormal   Collection Time: 12/15/22  9:15 AM  Result Value Ref Range   Salicylate Lvl <7.0 (L) 7.0 - 30.0 mg/dL    Comment: Performed at [facility], [address], [place], [place] [address]    No current facility-administered medications for this encounter.   Current Outpatient Medications  Medication Sig Dispense Refill   oxyCODONE-acetaminophen (PERCOCET/ROXICET) 5-325 MG tablet Take 1 tablet by mouth every 6 (six) hours as needed for severe pain. 5 tablet 0    Psychiatric Specialty Exam: Presentation  General Appearance:  Appropriate for Environment  Eye Contact: Fair  Speech: Clear and Coherent  Speech Volume: Normal  Handedness:No data recorded  Mood  and Affect  Mood: Anxious; Depressed; Hopeless  Affect: Congruent   Thought Process  Thought Processes: Coherent  Descriptions of Associations:Intact  Orientation:Full (Time, Place and Person)  Thought Content:WDL  History of Schizophrenia/Schizoaffective disorder:No data recorded Duration of Psychotic Symptoms:No data recorded Hallucinations:Hallucinations: None  Ideas of Reference:None  Suicidal Thoughts:Suicidal Thoughts: Yes, Active SI Active Intent and/or Plan: With Intent; With Plan  Homicidal Thoughts:Homicidal Thoughts: No   Sensorium  Memory: Immediate Fair; Recent Fair  Judgment: Fair  Insight: Fair   Art therapist  Concentration: Fair  Attention Span: Fair  Recall: Good  Fund of Knowledge: Good  Language: Good   Psychomotor Activity  Psychomotor Activity: Psychomotor Activity: Normal   Assets  Assets: Desire for Improvement; Physical Health; Resilience    Sleep  Sleep: Sleep: Fair   Physical Exam: Physical Exam ROS Blood pressure (!) 135/92, pulse 78, temperature 97.6 F (36.4 C), temperature source Oral, resp. rate 18, height 5\' 11"  (1.803 m), weight 79.4 kg, SpO2 95 %. Body mass index is 24.41 kg/m.  Medical Decision Making: Pt case reviewed and discussed with Dr. Viviano Simas. Will recommend inpatient psychiatric treatment at this time. Pt would meet criteria for IVC if requesting discharge, however he is agreeable with treatment plan at this time.   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

## 2022-12-15 NOTE — Progress Notes (Signed)
Pt participated in collaborative safety planning with nurse with encouragement.

## 2022-12-15 NOTE — Progress Notes (Signed)
Pt admitted at this time from [facility]. Pt endorses suicidal thoughts with a plan to slit his wrists. Pt will not contract for safety at this time. Pt presents tearful with sad affect. Eye contact is poor. Pt reports being homeless. Pt has been using cocaine and alcohol. Pt reports cocaine use is more prominent; noting his last drink was 5 days ago. Pt states he has hypertension. Pt denies withdrawal symptoms. Pt brought to unit, Staff nurse, charge nurse, and [facility] notified of pts inability to contract for safety.

## 2022-12-15 NOTE — ED Provider Notes (Signed)
[place] EMERGENCY DEPARTMENT AT [facility] Provider Note   CSN: [id number] Arrival date & time: 12/14/22  2336     History  Chief Complaint  Patient presents with   Spasms    Brian Daugherty is a 57 y.o. male.  57 year old male presents for evaluation.  Patient complains of crampy muscular pain yesterday afternoon.  Patient reports he was outside in the heat all day.  While waiting for a bus yesterday evening he decided to come to the ED for treatment of his muscular cramps.  He reports minimal p.o. fluid intake during the day yesterday.  On my evaluation this morning after lengthy wait time, the patient feels improved.  He complains of minimal cramps.  He is requesting IV fluids.  Otherwise he is without other complaint.  The history is provided by the patient and medical records.       Home Medications Prior to Admission medications   Medication Sig Start Date End Date Taking? Authorizing Provider  oxyCODONE-acetaminophen (PERCOCET/ROXICET) 5-325 MG tablet Take 1 tablet by mouth every 6 (six) hours as needed for severe pain. 08/12/22   Renne Crigler, PA-C      Allergies    Patient has no known allergies.    Review of Systems   Review of Systems  All other systems reviewed and are negative.   Physical Exam Updated Vital Signs BP (!) 135/92   Pulse 78   Temp 97.6 F (36.4 C) (Oral)   Resp 18   Ht 5\' 11"  (1.803 m)   Wt 79.4 kg   SpO2 95%   BMI 24.41 kg/m  Physical Exam Vitals and nursing note reviewed.  Constitutional:      General: He is not in acute distress.    Appearance: Normal appearance. He is well-developed.  HENT:     Head: Normocephalic and atraumatic.  Eyes:     Conjunctiva/sclera: Conjunctivae normal.     Pupils: Pupils are equal, round, and reactive to light.  Cardiovascular:     Rate and Rhythm: Normal rate and regular rhythm.     Heart sounds: Normal heart sounds.  Pulmonary:     Effort: Pulmonary effort is normal. No  respiratory distress.     Breath sounds: Normal breath sounds.  Abdominal:     General: There is no distension.     Palpations: Abdomen is soft.     Tenderness: There is no abdominal tenderness.  Musculoskeletal:        General: No deformity. Normal range of motion.     Cervical back: Normal range of motion and neck supple.  Skin:    General: Skin is warm and dry.  Neurological:     General: No focal deficit present.     Mental Status: He is alert and oriented to person, place, and time.     ED Results / Procedures / Treatments   Labs (all labs ordered are listed, but only abnormal results are displayed) Labs Reviewed  COMPREHENSIVE METABOLIC PANEL - Abnormal; Notable for the following components:      Result Value   Glucose, Bld 121 (*)    BUN 22 (*)    Calcium 8.7 (*)    AST 45 (*)    All other components within normal limits  ACETAMINOPHEN LEVEL - Abnormal; Notable for the following components:   Acetaminophen (Tylenol), Serum <10 (*)    All other components within normal limits  SALICYLATE LEVEL - Abnormal; Notable for the following components:   Salicylate Lvl <  7.0 (*)    All other components within normal limits  CBC WITH DIFFERENTIAL/PLATELET  ETHANOL  RAPID URINE DRUG SCREEN, HOSP PERFORMED    EKG None  Radiology No results found.  Procedures Procedures    Medications Ordered in ED Medications  sodium chloride 0.9 % bolus 1,000 mL (1,000 mLs Intravenous New Bag/Given 12/15/22 0751)    ED Course/ Medical Decision Making/ A&P                             Medical Decision Making Amount and/or Complexity of Data Reviewed Labs: ordered.    Medical Screen Complete  This patient presented to the ED with complaint of muscle cramps, heat exposure.  This complaint involves an extensive number of treatment options. The initial differential diagnosis includes, but is not limited to, heat exposure, metabolic abnormality, etc.  This presentation is:  Acute, Chronic, Self-Limited, Previously Undiagnosed, Uncertain Prognosis, Complicated, Systemic Symptoms, and Threat to Life/Bodily Function   Patient presented with complaint of muscle cramps after lengthy exposure to heat yesterday.  Patient had a significant wait time in the waiting room prior to evaluation.  On initial evaluation the patient reported feeling improved.  He was requesting something to eat he also quested IV fluids.  Screening labs obtained initially were without significant abnormality.  As discussion of discharge was occurring patient became tearful and upset.  He reports that "this meal is my last meal" - he apparently decided to endorse suicidality during DC discussion.  He reports a plan to go out and "cut his wrist with a razor."  He feels that the meal provided by the ED will be "his last meal".  With continued questioning the patient admits to several days of cocaine use.  Reports his last cocaine use was yesterday.  He denies other drug use.  He denies recent alcohol use.  He denies prior history of suicidality, depression, suicide attempt.  He continues to endorse suicidality with plan to cut himself and bleed to death.  He is unable to contract for safety.  He is medically clear for psychiatric / TTS evaluation.  Final disposition dependent upon psychiatric plan of care.    Additional history obtained:  External records from outside sources obtained and reviewed including prior ED visits and prior Inpatient records.    Lab Tests:  I ordered and personally interpreted labs.    Problem List / ED Course:  Heat exposure, reported cocaine use, suicidal   Reevaluation:  After the interventions noted above, I reevaluated the patient and found that they have: improved   Disposition:  After consideration of the diagnostic results and the patients response to treatment, I feel that the patent would benefit from TTS / psych evaluation.           Final Clinical Impression(s) / ED Diagnoses Final diagnoses:  Muscle spasm  Heat exposure, initial encounter  Suicidal ideation    Rx / DC Orders ED Discharge Orders     None         Wynetta Fines, MD 12/15/22 1421

## 2022-12-15 NOTE — ED Notes (Signed)
Request for pt's transport to [facility] via safety transport completed at this time.

## 2022-12-15 NOTE — Progress Notes (Signed)
[facility] LCSW Progress Note   12/15/2022    5:38 PM  Brian Daugherty   [id number]   Type of Contact and Topic:  Psychiatric Bed Placement   Pt accepted to [facility] 404-02    Patient meets inpatient criteria per Eligha Bridegroom, NP    The attending provider will be Dr. Enedina Finner   Call report to  [phone]    Bobette Mo, RN @ [facility] ED notified.     Pt scheduled  to arrive at [facility] TODAY.    Damita Dunnings, MSW, LCSW-A  5:39 PM 12/15/2022

## 2022-12-16 ENCOUNTER — Encounter (HOSPITAL_COMMUNITY): Payer: Self-pay

## 2022-12-16 DIAGNOSIS — F322 Major depressive disorder, single episode, severe without psychotic features: Secondary | ICD-10-CM | POA: Diagnosis not present

## 2022-12-16 DIAGNOSIS — F1590 Other stimulant use, unspecified, uncomplicated: Secondary | ICD-10-CM | POA: Insufficient documentation

## 2022-12-16 MED ORDER — FLUOXETINE HCL 20 MG PO CAPS
20.0000 mg | ORAL_CAPSULE | Freq: Every day | ORAL | Status: DC
  Administered 2022-12-17 – 2022-12-19 (×3): 20 mg via ORAL
  Filled 2022-12-16 (×5): qty 1

## 2022-12-16 MED ORDER — AMLODIPINE BESYLATE 5 MG PO TABS
5.0000 mg | ORAL_TABLET | Freq: Every day | ORAL | Status: DC
  Administered 2022-12-16 – 2022-12-19 (×4): 5 mg via ORAL
  Filled 2022-12-16 (×6): qty 1

## 2022-12-16 MED ORDER — FLUOXETINE HCL 10 MG PO CAPS
10.0000 mg | ORAL_CAPSULE | Freq: Once | ORAL | Status: AC
  Administered 2022-12-16: 10 mg via ORAL
  Filled 2022-12-16: qty 1

## 2022-12-16 NOTE — Progress Notes (Signed)
Adult Psychoeducational Group Note  Date:  12/16/2022 Time:  1:24 AM  Group Topic/Focus:  Wrap-Up Group:   The focus of this group is to help patients review their daily goal of treatment and discuss progress on daily workbooks.  Participation Level:  Did Not Attend  Participation Quality:  Appropriate  Affect:  Appropriate  Cognitive:  Appropriate  Insight: Appropriate  Engagement in Group:  None  Modes of Intervention:  Discussion  Additional Comments:  Pt. Did not attend group.  Joselyn Arrow 12/16/2022, 1:24 AM

## 2022-12-16 NOTE — BH IP Treatment Plan (Signed)
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)   Stimulant use disorder   Current Medications:  Current Facility-Administered Medications  Medication Dose Route Frequency Provider Last Rate Last Admin   acetaminophen (TYLENOL) tablet 650 mg  650 mg Oral Q6H PRN Ardis Hughs, NP       alum & mag hydroxide-simeth (MAALOX/MYLANTA) 200-200-20 MG/5ML suspension 30 mL  30 mL Oral Q4H PRN Ardis Hughs, NP       diphenhydrAMINE (BENADRYL) capsule 50 mg  50 mg Oral TID PRN Ardis Hughs, NP       Or   diphenhydrAMINE (BENADRYL) injection 50 mg  50 mg Intramuscular TID PRN Ardis Hughs, NP       [START ON 12/17/2022] FLUoxetine (PROZAC) capsule 20 mg  20 mg Oral Daily Nwoko, [name], NP       haloperidol (HALDOL) tablet 5 mg  5 mg Oral TID PRN Ardis Hughs, NP       Or   haloperidol lactate (HALDOL) injection 5 mg  5 mg Intramuscular TID PRN Ardis Hughs, NP       hydrOXYzine (ATARAX) tablet 25 mg  25 mg Oral TID PRN Ardis Hughs, NP       LORazepam (ATIVAN) tablet 2 mg  2 mg Oral TID PRN Ardis Hughs, NP       Or   LORazepam (ATIVAN) injection 2 mg  2 mg Intramuscular TID PRN Ardis Hughs, NP       magnesium hydroxide (MILK OF MAGNESIA) suspension 30 mL  30 mL Oral Daily PRN Ardis Hughs, NP       traZODone (DESYREL) tablet 50 mg  50 mg Oral QHS PRN Ardis Hughs, NP       PTA Medications: Medications Prior to Admission  Medication Sig Dispense Refill Last Dose   amLODipine (NORVASC) 5 MG tablet Take 1 tablet by mouth daily.    Past Month   sildenafil (VIAGRA) 25 MG tablet Take 25 mg by mouth as needed. (Patient not taking: Reported on 12/16/2022)   Not Taking   tamsulosin (FLOMAX) 0.4 MG CAPS capsule Take 0.4 mg by mouth daily. (Patient not taking: Reported on 12/16/2022)   Not Taking    Patient Stressors:    Patient Strengths:    Treatment Modalities: Medication Management, Group therapy, Case management,  1 to 1 session with clinician, Psychoeducation, Recreational therapy.   Physician Treatment Plan for Primary Diagnosis: Major depressive disorder, single episode, severe (HCC) Long Term Goal(s): Improvement in symptoms so as ready for discharge   Short Term Goals: Ability to identify and develop effective coping behaviors will improve Ability to maintain clinical measurements within normal limits will improve Compliance with prescribed medications will improve Ability to identify triggers associated with substance abuse/mental health issues will improve Ability to identify changes in lifestyle to reduce recurrence of condition will improve Ability to verbalize feelings will improve Ability to disclose and discuss suicidal ideas Ability to demonstrate self-control will improve  Medication Management: Evaluate patient's response, side effects, and tolerance of medication regimen.  Therapeutic Interventions: 1 to 1 sessions, Unit Group sessions and Medication administration.  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)   Stimulant use disorder  Long Term Goal(s): Improvement in symptoms so as ready for discharge   Short Term Goals: Ability to identify and develop effective coping behaviors will improve Ability to maintain  clinical measurements within normal limits will improve Compliance with prescribed medications will improve Ability to identify triggers associated with substance abuse/mental health issues will improve Ability to identify changes in lifestyle to reduce recurrence of condition will improve Ability to verbalize feelings will improve Ability to disclose and discuss suicidal ideas Ability to demonstrate self-control will improve     Medication Management: Evaluate patient's response, side effects, and tolerance of medication regimen.  Therapeutic Interventions: 1 to 1 sessions, Unit Group sessions and Medication administration.  Evaluation of Outcomes: Not Progressing   RN Treatment Plan for Primary Diagnosis: Major depressive disorder, single episode, severe (HCC) Long Term Goal(s): Knowledge of disease and therapeutic regimen to maintain health will improve  Short Term Goals: Ability to remain free from injury will improve, Ability to verbalize frustration and anger appropriately will improve, Ability to demonstrate self-control, Ability to participate in decision making will improve, Ability to verbalize feelings will improve, Ability to disclose and discuss suicidal ideas, Ability to identify and develop effective coping behaviors will improve, and Compliance with prescribed medications will improve  Medication Management: RN will administer medications as ordered by provider, will assess and evaluate patient's response and provide education to patient for prescribed medication. RN will report any adverse and/or side effects to prescribing provider.  Therapeutic Interventions: 1 on 1 counseling sessions, Psychoeducation, Medication administration, Evaluate responses to treatment, Monitor vital signs and CBGs as ordered, Perform/monitor CIWA, COWS, AIMS and Fall Risk screenings as ordered, Perform wound care treatments as ordered.  Evaluation of Outcomes: Not Progressing   LCSW Treatment  Plan for Primary Diagnosis: Major depressive disorder, single episode, severe (HCC) Long Term Goal(s): Safe transition to appropriate next level of care at discharge, Engage patient in therapeutic group addressing interpersonal concerns.  Short Term Goals: Engage patient in aftercare planning with referrals and resources, Increase social support, Increase ability to appropriately verbalize feelings, Increase emotional regulation, Facilitate acceptance of mental health diagnosis and concerns, Facilitate patient progression through stages of change regarding substance use diagnoses and concerns, Identify triggers associated with mental health/substance abuse issues, and Increase skills for wellness and recovery  Therapeutic Interventions: Assess for all discharge needs, 1 to 1 time with Social worker, Explore available resources and support systems, Assess for adequacy in community support network, Educate family and significant other(s) on suicide prevention, Complete Psychosocial Assessment, Interpersonal group therapy.  Evaluation of Outcomes: Not Progressing   Progress in Treatment: Attending groups: No. Participating in groups: No. Taking medication as prescribed: Yes. Toleration medication: Yes. Family/Significant other contact made: No, will contact:  Whoever pt gives CSW permission to contact  Patient understands diagnosis: Yes. Discussing patient identified problems/goals with staff: Yes. Medical problems stabilized or resolved: Yes. Denies suicidal/homicidal ideation: Yes. Issues/concerns per patient self-inventory: No.   New problem(s) identified: No, Describe:  None reported   New Short Term/Long Term Goal(s):detox, medication management for mood stabilization; elimination of SI thoughts; development of comprehensive mental wellness/sobriety plan   Patient Goals:  " Get into a residential or I am  going to end everything if I have to be homeless"   Discharge Plan or Barriers:  Patient recently admitted. CSW will continue to follow and assess for appropriate referrals and possible discharge planning.    Reason for Continuation of Hospitalization: Anxiety Depression Medication stabilization Suicidal ideation Withdrawal symptoms Other; describe Homeless   Estimated Length of Stay: 3-5 days   Last 3 [place] Suicide Severity Risk Score: Flowsheet Row [facility] (Current[facility] from 12/14/2022 in [facility] Emergency Department at [facility] ED from 08/09/2022 in [facility] Emergency Department at [facility]  C-SSRS RISK CATEGORY High Risk Low Risk No Risk       Last PHQ 2/9 Scores:     No data to display          Scribe for Treatment Team: Beather Arbour 12/16/2022 3:25 PM

## 2022-12-16 NOTE — [facility] Suicide Risk Assessment (Signed)
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  Continued Clinical Symptoms:  Alcohol Use Disorder Identification Test Final Score (AUDIT): 6 The "Alcohol Use Disorders Identification Test", Guidelines for Use in Primary Care, Second Edition.  World Science writer [facility]). Score between 0-7:  no or low risk or alcohol related problems. Score between 8-15:  moderate risk of alcohol related problems. Score between 16-19:  high risk of alcohol related problems. Score 20 or above:  warrants further diagnostic evaluation for alcohol dependence and treatment.  CLINICAL FACTORS:   Depression:   Hopelessness Impulsivity Insomnia Alcohol/Substance Abuse/Dependencies More than one psychiatric diagnosis  Musculoskeletal: Strength & Muscle Tone: within normal limits Gait & Station: normal Patient leans: N/A  Psychiatric Specialty Exam:  Presentation  General Appearance:  Appropriate for Environment  Eye Contact: Fair  Speech: Clear and Coherent  Speech Volume: Normal  Handedness:No data recorded  Mood and Affect  Mood: Anxious; Depressed; Hopeless  Affect: Congruent  Thought Process  Thought Processes: Coherent  Descriptions of Associations:Intact  Orientation:Full (Time, Place and  Person)  Thought Content:WDL  History of Schizophrenia/Schizoaffective disorder:No data recorded Duration of Psychotic Symptoms:No data recorded Hallucinations:Hallucinations: None  Ideas of Reference:None  Suicidal Thoughts:Suicidal Thoughts: Yes, Active SI Active Intent and/or Plan: With Intent; With Plan  Homicidal Thoughts:Homicidal Thoughts: No  Sensorium  Memory: Immediate Fair; Recent Fair  Judgment: Fair  Insight: Fair  Art therapist  Concentration: Fair  Attention Span: Fair  Recall: Good  Fund of Knowledge: Good  Language: Good  Psychomotor Activity  Psychomotor Activity: Psychomotor Activity: Normal  Assets  Assets: Desire for Improvement; Physical Health; Resilience  Sleep  Sleep: Sleep: Fair  Physical Exam: See H&P. Blood pressure 131/86, pulse 76, temperature 98.4 F (36.9 C), temperature source Oral, resp. rate 16, height 5\' 11"  (1.803 m), weight 78.9 kg. Body mass index is 24.27 kg/m.  COGNITIVE FEATURES THAT CONTRIBUTE TO RISK:  Closed-mindedness, Polarized thinking, and Thought constriction (tunnel vision)    SUICIDE RISK:   Severe:  Frequent, intense, and enduring suicidal ideation, specific plan, no subjective intent, but some objective markers of intent (i.e., choice of lethal method), the method is accessible, some limited preparatory behavior, evidence of impaired self-control, severe dysphoria/symptomatology, multiple risk factors present, and few if any protective factors, particularly a lack of social support.  PLAN OF CARE: See H&P  I certify that inpatient services furnished can reasonably be expected to improve the patient's condition.   Armandina Stammer, NP, pmhnp, fnp-bc 12/16/2022, 8:55 AM

## 2022-12-16 NOTE — Progress Notes (Signed)
   12/16/22 2300  Psych Admission Type (Psych Patients Only)  Admission Status Voluntary  Psychosocial Assessment  Patient Complaints Anxiety;Isolation  Eye Contact Brief  Facial Expression Animated  Affect Depressed  Speech Logical/coherent  Interaction Cautious  Motor Activity Slow  Appearance/Hygiene Unremarkable  Behavior Characteristics Cooperative;Appropriate to situation  Mood Depressed  Thought Process  Coherency WDL  Content WDL  Delusions None reported or observed  Perception WDL  Hallucination None reported or observed  Judgment Poor  Confusion None  Danger to Self  Current suicidal ideation? Denies  Agreement Not to Harm Self Yes  Description of Agreement verbal  Danger to Others  Danger to Others None reported or observed

## 2022-12-16 NOTE — Progress Notes (Signed)
   12/16/22 1641  Vital Signs  Temp 98.2 F (36.8 C)  Temp Source Oral  Pulse Rate 92  Pulse Rate Source Monitor  BP (!) 139/107  BP Method Automatic  Oxygen Therapy  SpO2 100 %   Pt takes Norvasc 5mg  PO daily at home. Received order to restart medication.

## 2022-12-16 NOTE — Progress Notes (Signed)
   12/15/22 2150  Psych Admission Type (Psych Patients Only)  Admission Status Voluntary  Psychosocial Assessment  Patient Complaints Anger  Eye Contact Brief  Facial Expression Sad  Affect Depressed;Sad  Speech Logical/coherent  Interaction Isolative;Minimal  Motor Activity Slow  Appearance/Hygiene In scrubs  Behavior Characteristics Guarded  Mood Depressed  Thought Process  Coherency WDL  Content WDL  Delusions None reported or observed  Perception WDL  Hallucination None reported or observed  Judgment WDL  Confusion None  Danger to Self  Current suicidal ideation? Denies  Agreement Not to Harm Self Yes  Description of Agreement verbal  Danger to Others  Danger to Others None reported or observed   Pt denied SI and verbally contracted for safety. Pt was offered support and encouragement. Pt was encouraged to attend group. Q 15 minute checks were done for safety. Pt did not attend group. Isolative to room and minimally interactive with peers and staff. Pt has no complaints.Pt receptive to treatment and safety maintained on unit.

## 2022-12-16 NOTE — Group Note (Signed)
Recreation Therapy Group Note   Group Topic:Problem Solving  Group Date: 12/16/2022 Start Time: 0930 End Time: 1000 Facilitators: Lamarkus Nebel-McCall, LRT,CTRS Location: [address] Dayroom   Goal Area(s) Addresses:  Patient will effectively work with peer towards shared goal.  Patient will identify skills used to make activity successful.  Patient will share challenges and verbalize solution-driven approaches used. Patient will identify how skills used during activity can be used to reach post d/c goals.   Group Description: [employer]. Patients were provided the following materials: 4 drinking straws, 5 rubber bands, 5 paper clips, 2 index cards and 2 drinking cups. Using the provided materials patients were asked to build a launching mechanism to launch a ping pong ball across the room, approximately 10 feet. Patients were divided into teams of 3-5. Instructions required all materials be incorporated into the device, functionality of items left to the peer group's discretion.   Affect/Mood: N/A   Participation Level: Did not attend    Clinical Observations/Individualized Feedback:     Plan: Continue to engage patient in RT group sessions 2-3x/week.   Caroll Rancher, Antonietta Jewel 12/16/2022 12:52 PM

## 2022-12-16 NOTE — Progress Notes (Addendum)
Nursing Note: 0700-1900  Pt slept in this am, woke up irritable and shared that he would like to transfer to [facility].  Pt had interview (with [facility]) today at 1300 and was instructed to call back Thursday for an update on potential admission to program. Pt was initially guarded while interacting this am, but did lower his guard and shared that he was mad at himself for getting in this position. "I am not made for the outdoors, I can't sleep outside, I need rehab and a job- I hope they will let me out of here to get that help."  Pt reminded that he needs to be accepted to [facility] before demanding a quick discharge. Pt verbalized understanding. Pt denies current SI and is able to verbally contract for safety., Denies AV hallucinations. Pt started Prozac 10mg  PO today, education provided prior to administration. Pt on the phone intermittently throughout the shift, did not attend groups. "This place won't be able to help me, I don't need any medication."  Pt. encouraged to verbalize needs and concerns, active listening and support provided.  Continued Q 15 minute safety checks.    12/16/22 1000  Psych Admission Type (Psych Patients Only)  Admission Status Voluntary  Psychosocial Assessment  Patient Complaints Irritability  Eye Contact Brief  Facial Expression Flat  Affect Depressed;Sad  Speech Logical/coherent  Interaction Assertive;Defensive  Motor Activity Slow  Appearance/Hygiene Unremarkable  Behavior Characteristics Cooperative;Guarded  Mood Depressed;Anxious  Thought Process  Coherency WDL  Content Blaming self  Delusions None reported or observed  Perception WDL  Hallucination None reported or observed  Judgment Impaired  Confusion None  Danger to Self  Current suicidal ideation? Denies  Agreement Not to Harm Self Yes  Description of Agreement Verbal  Danger to Others  Danger to Others None reported or observed

## 2022-12-16 NOTE — H&P (Signed)
Psychiatric Admission Assessment Adult  Patient Identification: Brian Daugherty  MRN:  [id number]  Date of Evaluation:  12/16/2022  Chief Complaint: Suicidal ideations triggered by homelessness/drug addictions.  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 Admitted to the [facility] from the [facility] hospital ED with complaint of suicidal ideations triggered by his addiction to cocaine that led to his homelessness. A review of his toxicology results has shown a BAL of <10, there were no UDS reports. Patient apparently was taken to the [facility] ED with complaint of muscle cramps to his leg areas induced by dehydration as a result of homelessness. After medical evaluation, stabilization & clearance, chart review reports showed that patient became tearful & expressed suicidal ideations with plans to cut his wrists. He was later transferred to the [facility] for further psychiatric evaluation/treatments. During this evaluation, [name] reports,   "I was taken to the [facility] ED 2 days ago. I called them because I was having cramps on my legs.& I could not walk. I was told at the ED that I was dehydrated. They gave me some IV fluids & I expressed to the staff while I was at the ED that I did not want to live any more. The thought of not wanting to live any more started a couple of days ago for me. I do not have any where to call home. I have no place to go. I cannot continue to live outside. I have been homeless for two days after I  got [facility] of the [facility] where I have been living for one month. Prior to moving into an [facility], I was living with my mother. But my mother passed away this past 05-Jul-2022. I have drug problems. I have been abusing cocaine for two years. I have been depressed pretty bad since my mother died. But, I do not want to be on depression medicines because I don't know what they will do to me or how they will make me feel. I guess I could also try medicine while I'm here because my depression right now is #10 & anxiety #10. I sleep very poorly at night. I'm currently unemployed, unmarried & I have no children".  Objective: Ples presents with a flat affect, making a fair eye contact. He is verbally responsive, appears a good historian.  He appears disheveled. Although endorsing worsening depression, he currently denies any SIHI, AVH, delusional thoughts or paranoia. He does not appear to be responding to any internal stimuli. He has reluctantly agreed to try an antidepressant. He denies any hx of suicide attempts. He denies any familial hx of mental illnesses or suicide attempts/completions. This patient may be a candidate for a long term substance abuse treatment program referral. See the treatment plan below.  Associated Signs/Symptoms:  Depression Symptoms:  depressed mood, insomnia, feelings of worthlessness/guilt, hopelessness, anxiety,  (Hypo) Manic Symptoms:  Impulsivity,  Anxiety Symptoms:  Excessive Worry,  Psychotic Symptoms:   Patient currently denies any AVH, delusional thoughts or paranoia. He does not appear to be responding  to any internal stimuli.  PTSD Symptoms: NA  Total Time spent with patient: 1 hour  Past Psychiatric History: Patient denies any hx of mental health issues, hospitalizations or treatments.  Is the patient at risk to self? No.  Has the patient been a risk to self in the past 6 months? Yes.    Has the patient been a risk to self within the distant  past? Yes.    Is the patient a risk to others? No.  Has the patient been a risk to others in the past 6 months? No.  Has the patient been a risk to others within the distant past? No.   [place] Scale:  Flowsheet Row Ad[facility] (Current) [facility] from 12/14/2022 in [facility] Department at [facility] ED from 08/09/2022 in [facility] Emergency Department at [facility]  C-SSRS RISK CATEGORY High Risk Low Risk No Risk      Prior Inpatient Therapy: No. If yes, describe: NA   Prior Outpatient Therapy: No. If yes, describe: NA   Alcohol Screening: 1. How often do you have a drink containing alcohol?: 2 to 3 times a week 2. How many drinks containing alcohol do you have on a typical day when you are drinking?: 3 or 4 3. How often do you have six or more drinks on one occasion?: Monthly AUDIT-C Score: 6 4. How often during the last year have you found that you were not able to stop drinking once you had started?: Never 5. How often during the last year have you failed to do what was normally expected from you because of drinking?: Never 6. How often during the last year have you needed a first drink in the morning to get yourself going after a heavy drinking session?: Never 7. How often during the last year have you had a feeling of guilt of remorse after drinking?: Never 8. How often during the last year have you been unable to remember what happened the night before because you had been drinking?: Never 9. Have you or someone else been injured as a result of your drinking?: No 10. Has a relative or friend or a doctor or another health worker been concerned about your drinking or suggested you cut down?: No Alcohol Use Disorder Identification Test Final Score (AUDIT): 6 Alcohol Brief Interventions/Follow-up: Alcohol education/Brief advice  Substance Abuse History in the last 12 months:  Yes.    Consequences  of Substance Abuse: Discussed with patient during this admission evaluation. Medical Consequences:  Liver damage, Possible death by overdose Legal Consequences:  Arrests, jail time, Loss of driving privilege. Family Consequences:  Family discord, divorce and or separation.  Previous Psychotropic Medications: No   Psychological Evaluations: No   Past Medical History:  Past Medical History:  Diagnosis Date   Glaucoma    Hyperlipemia    Hypertension     Past Surgical History:  Procedure Laterality Date   LEG SURGERY     LUNG SURGERY     Family History: History reviewed. No pertinent family history.  Family Psychiatric  History: Patient currently denies any familial hx of mental illnesses.  Tobacco Screening:  Social History   Tobacco Use  Smoking Status Every Day   Packs/day: .5   Types: Cigarettes  Smokeless Tobacco Never    BH Tobacco Counseling     Are you interested in Tobacco Cessation Medications?  No value filed. Counseled patient on smoking cessation:  No value filed. Reason Tobacco Screening Not Completed: No value filed.       Social History: Single, has no children, unemployed, homeless in [place], [place] area. Social History   Substance and Sexual Activity  Alcohol Use No     Social History   Substance and Sexual Activity  Drug Use No    Additional Social History:  Allergies:  No Known Allergies Lab Results:  Results for orders placed or performed during the hospital encounter of 12/14/22 (from the past 48 hour(s))  CBC with Differential     Status: None   Collection Time: 12/15/22 12:10 AM  Result Value Ref Range   WBC 6.2 4.0 - 10.5 K/uL   RBC 5.24 4.22 - 5.81 MIL/uL   Hemoglobin 14.2 13.0 - 17.0 g/dL   HCT 21.3 08.6 - 57.8 %   MCV 81.5 80.0 - 100.0 fL   MCH 27.1 26.0 - 34.0 pg   MCHC 33.3 30.0 - 36.0 g/dL   RDW 46.9 62.9 - 52.8 %   Platelets 241 150 - 400 K/uL   nRBC 0.0 0.0 - 0.2 %   Neutrophils Relative % 64 %   Neutro Abs 4.0 1.7  - 7.7 K/uL   Lymphocytes Relative 24 %   Lymphs Abs 1.5 0.7 - 4.0 K/uL   Monocytes Relative 9 %   Monocytes Absolute 0.6 0.1 - 1.0 K/uL   Eosinophils Relative 2 %   Eosinophils Absolute 0.1 0.0 - 0.5 K/uL   Basophils Relative 1 %   Basophils Absolute 0.0 0.0 - 0.1 K/uL   Immature Granulocytes 0 %   Abs Immature Granulocytes 0.01 0.00 - 0.07 K/uL    Comment: Performed at [facility], [address] [place], [place] [address]  Comprehensive metabolic panel     Status: Abnormal   Collection Time: 12/15/22  1:20 AM  Result Value Ref Range   Sodium 140 135 - 145 mmol/L   Potassium 3.5 3.5 - 5.1 mmol/L   Chloride 110 98 - 111 mmol/L   CO2 22 22 - 32 mmol/L   Glucose, Bld 121 (H) 70 - 99 mg/dL    Comment: Glucose reference range applies only to samples taken after fasting for at least 8 hours.   BUN 22 (H) 6 - 20 mg/dL   Creatinine, Ser 4.01 0.61 - 1.24 mg/dL   Calcium 8.7 (L) 8.9 - 10.3 mg/dL   Total Protein 6.9 6.5 - 8.1 g/dL   Albumin 3.7 3.5 - 5.0 g/dL   AST 45 (H) 15 - 41 U/L   ALT 28 0 - 44 U/L   Alkaline Phosphatase 51 38 - 126 U/L   Total Bilirubin 0.8 0.3 - 1.2 mg/dL   GFR, Estimated >02 >72 mL/min    Comment: (NOTE) Calculated using the CKD-EPI Creatinine Equation (2021)    Anion gap 8 5 - 15    Comment: Performed at [facility], [address] [place], [place] [address]  Acetaminophen level     Status: Abnormal   Collection Time: 12/15/22  9:15 AM  Result Value Ref Range   Acetaminophen (Tylenol), Serum <10 (L) 10 - 30 ug/mL    Comment: (NOTE) Therapeutic concentrations vary significantly. A range of 10-30 ug/mL  may be an effective concentration for many patients. However, some  are best treated at concentrations outside of this range. Acetaminophen concentrations >150 ug/mL at 4 hours after ingestion  and >50 ug/mL at 12 hours after ingestion are often associated with  toxic reactions.  Performed at [facility], [address]  [place], [place] [address]   Ethanol     Status: None   Collection Time: 12/15/22  9:15 AM  Result Value Ref Range   Alcohol, Ethyl (B) <10 <10 mg/dL    Comment: (NOTE) Lowest detectable limit for serum alcohol is 10 mg/dL.  For medical purposes only. Performed at [facility], [address], [place], [place] [address]   Salicylate level     Status: Abnormal   Collection Time: 12/15/22  9:15 AM  Result Value Ref Range   Salicylate Lvl <7.0 (L) 7.0 - 30.0 mg/dL    Comment: Performed at [facility], [address], [place], [place] [address]   Blood Alcohol level:  Lab Results  Component Value Date   ETH <10 12/15/2022   Metabolic Disorder Labs:  No results found for: "HGBA1C", "MPG" No results found for: "PROLACTIN" Lab Results  Component Value Date   CHOL 166 08/25/2009   TRIG 75.0 08/25/2009   HDL 58.90 08/25/2009   CHOLHDL 3 08/25/2009   VLDL 15.0 08/25/2009   LDLCALC 92 08/25/2009   Current Medications: Current Facility-Administered Medications  Medication Dose Route Frequency Provider Last Rate Last Admin   acetaminophen (TYLENOL) tablet 650 mg  650 mg Oral Q6H PRN Ardis Hughs, NP       alum & mag hydroxide-simeth (MAALOX/MYLANTA) 200-200-20 MG/5ML suspension 30 mL  30 mL Oral Q4H PRN Ardis Hughs, NP       diphenhydrAMINE (BENADRYL) capsule 50 mg  50 mg Oral TID PRN Ardis Hughs, NP       Or   diphenhydrAMINE (BENADRYL) injection 50 mg  50 mg Intramuscular TID PRN Ardis Hughs, NP       FLUoxetine (PROZAC) capsule 10 mg  10 mg Oral Once Armandina Stammer I, NP       Melene Muller ON 12/17/2022] FLUoxetine (PROZAC) capsule 20 mg  20 mg Oral Daily Lamesha Tibbits I, NP       haloperidol (HALDOL) tablet 5 mg  5 mg Oral TID PRN Ardis Hughs, NP       Or   haloperidol lactate (HALDOL) injection 5 mg  5 mg Intramuscular TID PRN Ardis Hughs, NP       hydrOXYzine (ATARAX) tablet 25 mg  25 mg Oral TID PRN Ardis Hughs, NP        LORazepam (ATIVAN) tablet 2 mg  2 mg Oral TID PRN Ardis Hughs, NP       Or   LORazepam (ATIVAN) injection 2 mg  2 mg Intramuscular TID PRN Ardis Hughs, NP       magnesium hydroxide (MILK OF MAGNESIA) suspension 30 mL  30 mL Oral Daily PRN Ardis Hughs, NP       traZODone (DESYREL) tablet 50 mg  50 mg Oral QHS PRN Ardis Hughs, NP       PTA Medications: Medications Prior to Admission  Medication Sig Dispense Refill Last Dose   amLODipine (NORVASC) 5 MG tablet Take 1 tablet by mouth daily.   Past Month   sildenafil (VIAGRA) 25 MG tablet Take 25 mg by mouth as needed. (Patient not taking: Reported on 12/16/2022)   Not Taking   tamsulosin (FLOMAX) 0.4 MG CAPS capsule Take 0.4 mg by mouth daily. (Patient not taking: Reported on 12/16/2022)   Not Taking   Musculoskeletal: Strength & Muscle Tone: within normal limits Gait &  Station: normal Patient leans: N/A  Psychiatric Specialty Exam:  Presentation  General Appearance:  Appropriate for Environment  Eye Contact: Fair  Speech: Clear and Coherent  Speech Volume: Normal  Handedness:No data recorded  Mood and Affect  Mood: Anxious; Depressed; Hopeless  Affect: Congruent  Thought Process  Thought Processes: Coherent  Duration of Psychotic Symptoms:N/A  Past Diagnosis of Schizophrenia or Psychoactive disorder: No data recorded  Descriptions of Associations:Intact  Orientation:Full (Time, Place and Person)  Thought Content:WDL  Hallucinations:Hallucinations: None  Ideas of Reference:None  Suicidal Thoughts:Suicidal Thoughts: Yes, Active SI Active Intent and/or Plan: With Intent; With Plan  Homicidal Thoughts:Homicidal Thoughts: No  Sensorium  Memory: Immediate Fair; Recent Fair  Judgment: Fair  Insight: Fair  Art therapist  Concentration: Fair  Attention Span: Fair  Recall: Good  Fund of Knowledge: Good  Language: Good  Psychomotor Activity  Psychomotor  Activity: Psychomotor Activity: Normal   Assets  Assets: Desire for Improvement; Physical Health; Resilience  Sleep  Sleep: Sleep: Fair  Physical Exam: Physical Exam Vitals and nursing note reviewed.  HENT:     Head: Normocephalic.     Nose: Nose normal.     Mouth/Throat:     Pharynx: Oropharynx is clear.  Eyes:     Pupils: Pupils are equal, round, and reactive to light.  Cardiovascular:     Rate and Rhythm: Normal rate.     Pulses: Normal pulses.  Pulmonary:     Effort: Pulmonary effort is normal.  Genitourinary:    Comments: Deferred Musculoskeletal:        General: Normal range of motion.     Cervical back: Normal range of motion.  Skin:    General: Skin is warm and dry.  Neurological:     General: No focal deficit present.     Mental Status: He is alert and oriented to person, place, and time.    Review of Systems  Constitutional:  Negative for chills, diaphoresis and fever.  HENT:  Negative for congestion and sore throat.   Eyes:  Negative for blurred vision.  Respiratory:  Negative for cough, shortness of breath and wheezing.   Cardiovascular:  Negative for chest pain and palpitations.  Gastrointestinal:  Negative for abdominal pain, constipation, diarrhea, heartburn, nausea and vomiting.  Genitourinary:  Negative for dysuria.  Musculoskeletal:  Negative for joint pain and myalgias.  Skin:  Negative for itching and rash.  Neurological:  Negative for dizziness, tingling, tremors, sensory change, speech change, focal weakness, seizures, loss of consciousness, weakness and headaches.  Endo/Heme/Allergies:        NKDA  Psychiatric/Behavioral:  Positive for depression, substance abuse and suicidal ideas. Negative for hallucinations and memory loss. The patient is nervous/anxious and has insomnia.    Blood pressure 131/86, pulse 76, temperature 98.4 F (36.9 C), temperature source Oral, resp. rate 16, height 5\' 11"  (1.803 m), weight 78.9 kg. Body mass index is  24.27 kg/m.  Treatment Plan Summary: Daily contact with patient to assess and evaluate symptoms and progress in treatment and Medication management.   Principal/active diagnoses. Major depressive disorder, single episode, severe.  Stimulant use disorder (cocaine).   Associated symptoms; Suicidal ideations.  Plan: The risks/benefits/side-effects/alternatives to the medications in use were discussed in detail with the patient and time was given for patient's questions. The patient consents to medication trial.  -Initiated Fluoxetine 10 mg po x one dose today.  -Fluoxetine 20 mg po Q daily (start on 12-17-22). -Continue hydroxyzine 25 mg po tid prn for anxiety.  -  Continue Trazodone 50 mg po Q hs prn for insomnia.  Agitation protocols: Cont as recommended;  -Benadryl 50 mg po or IM tid prn. -Haldol 5 mg po or IM tid prn.  -Lorazepam 2 mg po or IM tid prn.  Other PRNS -Continue Tylenol 650 mg every 6 hours PRN for mild pain -Continue Maalox 30 ml Q 4 hrs PRN for indigestion -Continue MOM 30 ml po Q 6 hrs for constipation  Safety and Monitoring: Voluntary admission to inpatient psychiatric unit for safety, stabilization and treatment Daily contact with patient to assess and evaluate symptoms and progress in treatment Patient's case to be discussed in multi-disciplinary team meeting Observation Level : q15 minute checks Vital signs: q12 hours Precautions: Safety  Discharge Planning: Social work and case management to assist with discharge planning and identification of hospital follow-up needs prior to discharge Estimated LOS: 5-7 days Discharge Concerns: Need to establish a safety plan; Medication compliance and effectiveness Discharge Goals: Return home with outpatient referrals for mental health follow-up including medication management/psychotherapy  Observation Level/Precautions:  15 minute checks  Laboratory:   Per ED, current lab results reviewed, will obtain TSH, Lipid  panel, Hgba1c..  Psychotherapy: Enrolled in the group sessions.   Medications:  See MAR.  Consultations: As needed.   Discharge Concerns: Safety, mood stability, maintaining sobriety.  Estimated LOS: 3-5 days.  Other: nA    Physician Treatment Plan for Primary Diagnosis: Major depressive disorder, single episode, severe (HCC)  Long Term Goal(s): Improvement in symptoms so as ready for discharge  Short Term Goals: Ability to identify changes in lifestyle to reduce recurrence of condition will improve, Ability to verbalize feelings will improve, Ability to disclose and discuss suicidal ideas, and Ability to demonstrate self-control will improve  Physician Treatment Plan for Secondary Diagnosis: Principal Problem:   Major depressive disorder, single episode, severe (HCC) Active Problems:   Suicidal ideation   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    Armandina Stammer, NP, pmhnp, fnp-bc 6/24/202411:53 AM

## 2022-12-16 NOTE — Progress Notes (Signed)
Adult Psychoeducational Group Note  Date:  12/16/2022 Time:  10:33 PM  Group Topic/Focus:  Wrap-Up Group:   The focus of this group is to help patients review their daily goal of treatment and discuss progress on daily workbooks.  Participation Level:  Did Not Attend  Participation Quality:  Appropriate  Affect:  Appropriate  Cognitive:  Appropriate  Insight: Appropriate  Engagement in Group:  Engaged  Modes of Intervention:  Discussion  Additional Comments:  Pt, did not attend group  Joselyn Arrow 12/16/2022, 10:33 PM

## 2022-12-17 DIAGNOSIS — F322 Major depressive disorder, single episode, severe without psychotic features: Secondary | ICD-10-CM | POA: Diagnosis not present

## 2022-12-17 LAB — HEMOGLOBIN A1C
Hgb A1c MFr Bld: 5.6 % (ref 4.8–5.6)
Mean Plasma Glucose: 114.02 mg/dL

## 2022-12-17 LAB — LIPID PANEL
Cholesterol: 155 mg/dL (ref 0–200)
HDL: 41 mg/dL (ref >40)
LDL Cholesterol: 89 mg/dL (ref 0–99)
Total CHOL/HDL Ratio: 3.8 RATIO
Triglycerides: 126 mg/dL (ref <150)
VLDL: 25 mg/dL (ref 0–40)

## 2022-12-17 LAB — TSH: TSH: 0.696 u[IU]/mL (ref 0.350–4.500)

## 2022-12-17 NOTE — [facility] Counselor (Signed)
Adult Comprehensive Assessment  Patient ID: Brian Daugherty, male   DOB: [birth year], 57 y.o.   MRN: [id number]  Information Source: Information source: Patient  Current Stressors:  Patient states their primary concerns and needs for treatment are:: " I got put out and did not want to be homeless, because I have never been  " Patient states their goals for this hospitilization and ongoing recovery are:: " get into [facility] or DC back to my GF " Educational / Learning stressors: None Reported Employment / Job issues: Has no job Family Relationships: None reported Surveyor, quantity / Lack of resources (include bankruptcy): No income Housing / Lack of housing: Homeless Physical health (include injuries & life threatening diseases): None reported Social relationships: None reported Substance abuse: None reported Bereavement / Loss: None reported  Living/Environment/Situation:  Living Arrangements: Alone Living conditions (as described by patient or guardian): Homeless Who else lives in the home?: alone How long has patient lived in current situation?: 2 months What is atmosphere in current home: Temporary  Family History:  Marital status: Long term relationship Long term relationship, how long?: Pt did not say What types of issues is patient dealing with in the relationship?: None reported Additional relationship information: NA Are you sexually active?: Yes What is your sexual orientation?: Heterosexual Has your sexual activity been affected by drugs, alcohol, medication, or emotional stress?: None reported Does patient have children?: Yes How many children?: 1 How is patient's relationship with their children?: States that he is close with his son  Childhood History:  By whom was/is the patient raised?: Mother Description of patient's relationship with caregiver when they were a child: " Good " Patient's description of current relationship with people who raised him/her: " she passed in  January of this year " How were you disciplined when you got in trouble as a child/adolescent?: DNA Does patient have siblings?: No Did patient suffer any verbal/emotional/physical/sexual abuse as a child?: No Did patient suffer from severe childhood neglect?: No Has patient ever been sexually abused/assaulted/raped as an adolescent or adult?: No Was the patient ever a victim of a crime or a disaster?: No Witnessed domestic violence?: No Has patient been affected by domestic violence as an adult?: No  Education:  Highest grade of school patient has completed: Transport planner in Business Currently a student?: No Learning disability?: No  Employment/Work Situation:   Employment Situation: Unemployed Patient's Job has Been Impacted by Current Illness: No What is the Longest Time Patient has Held a Job?: 20 years 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?: No  Financial Resources:   Surveyor, quantity resources: Media planner Does patient have a Lawyer or guardian?: No  Alcohol/Substance Abuse:   What has been your use of drugs/alcohol within the last 12 months?: Cocaine and liquor If attempted suicide, did drugs/alcohol play a role in this?: No Alcohol/Substance Abuse Treatment Hx: Past detox, Past Tx, Inpatient If yes, describe treatment: [facility] and [facility] Has alcohol/substance abuse ever caused legal problems?: No  Social Support System:   Conservation officer, nature Support System: Fair Development worker, community Support System: Girlfriend Type of faith/religion: Ephriam Knuckles How does patient's faith help to cope with current illness?: " church , read my bible, and pray "  Leisure/Recreation:   Do You Have Hobbies?: Yes Leisure and Hobbies: basketball and movies  Strengths/Needs:   What is the patient's perception of their strengths?: None reported Patient states they can use these personal  strengths  during their treatment to contribute to their recovery: None reported Patient states these barriers may affect/interfere with their treatment: " being here " Patient states these barriers may affect their return to the community: " i am ready to leave now " Other important information patient would like considered in planning for their treatment: None reported  Discharge Plan:   Currently receiving community mental health services: No (" I don't need one ") Patient states concerns and preferences for aftercare planning are: Pt states that he is going to stay with his Girlfriend and go to [facility] Patient states they will know when they are safe and ready for discharge when: " I am ready to leave now" Does patient have access to transportation?: Yes Does patient have financial barriers related to discharge medications?: No Plan for living situation after discharge: Pt states that he is going to stay with his GF or go To [facility] Will patient be returning to same living situation after discharge?: No  Summary/Recommendations:   Summary and Recommendations (to be completed by the evaluator): Brian Daugherty is a 57 y/o male who stated that he became homeless 2 months ago and that he has never been homeless before. Brian Daugherty during assessment, was slightly irritable , stating that he is ready to leave because now he has somewhere to go. Brian Daugherty states that his stressors when he came in was being homeless. This is Brian Daugherty first hospitalization.Brian Daugherty states that he recently loss his mom but that it was not a stressor not his drug use of cocaine. Brian Daugherty states that he is interested in [facility] and when he called them, they said that he did not need a referral. Brian Daugherty denies any abuse as a child / adolescent / adult, no witness or being involved in DV , currently unemployed, and states that he does drink liquor. Furthermore, Brian Daugherty current diagnosis are Cocaine us/ cocaine Indude mood disorder, MDD, and SI. Patient is not  connected to any outside proiders and refused appointments stating, " I do not need a psychiatrist or therapist " .While here, Brian Daugherty can benefit from crisis stabilization, medication management, therapeutic milieu, and referrals for services.   Isabella Bowens. 12/17/2022

## 2022-12-17 NOTE — Group Note (Signed)
Date:  12/17/2022 Time:  12:20 PM  Group Topic/Focus:  Coping With Mental Health Crisis:   The purpose of this group is to help patients identify strategies for coping with mental health crisis.  Group discusses possible causes of crisis and ways to manage them effectively. Early Warning Signs:   The focus of this group is to help patients identify signs or symptoms they exhibit before slipping into an unhealthy state or crisis.    Participation Level:  Did Not Attend  Participation Quality:    Affect:    Cognitive:    Insight:   Engagement in Group:    Modes of Intervention:    Additional Comments:    Romina Divirgilio M Marquarius Lofton 12/17/2022, 12:20 PM  

## 2022-12-17 NOTE — Progress Notes (Signed)
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 Admitted to the [facility] from the [facility] hospital ED with complaint of suicidal ideations triggered by his addiction to cocaine that led to his homelessness. A review of his toxicology results has shown a BAL of <10, there were no UDS reports. Patient apparently was taken to the [facility] ED with complaint of muscle cramps to his leg areas induced by dehydration as a result of homelessness. After medical evaluation, stabilization & clearance, chart review reports showed that patient became tearful & expressed suicidal ideations with plans to cut his wrists. He was later transferred to the [facility] for further psychiatric evaluation/treatments.   24-hour chart review: Sleep and was not documented, but patient reports a good sleep quality last night.  DBP with periods of elevations in the 90s to low 100s.  No as needed medications given overnight, no behavioral issues reported overnight.  Patient assessment note: Mood remains depressed, even though patient denies being depressed, and signed his 72-hour notice for discharge today at 12:48 PM as per nursing documentation.  He denies SI/HI/AVH, denies paranoia, states he verbalized suicidal ideations in hopes of wanting gains of hospitalization so that he can get shelter and food.  He reports that his girlfriend is agreeable for him to come reside with her, and he would like to be  discharged.  Patient has been educated by Clinical research associate that he still needs to be observed, because mood remains depressed, and he verbalized suicidal ideations at the ER with plans to cut his wrist.  In addition to this, CSW reached out to patient's girlfriend who states that this is not a fixed arrangement, and that patient can stay with her just for a few days.  Patient has been educated on the 72-hour notice for discharge, including the fact that if his providers determined that he is a danger to self or others, he will be involuntarily committed prior to the 72 hours being up.  He reports a good sleep quality last night, reports a good appetite, denies being in any physical pain, states he is tolerating medications well, we will continue medications as listed below with no changes today.  Labs reviewed: Ordered lipid panel test, and vitamin D levels.  Total Time spent with patient: 45 minutes  Past Psychiatric History: [facility]  Past Medical History:  Past Medical History:  Diagnosis Date   Glaucoma    Hyperlipemia    Hypertension     Past Surgical History:  Procedure Laterality Date   LEG SURGERY     LUNG SURGERY     Family History: History reviewed. No pertinent family history. Family Psychiatric  History: See H & P Social History:  Social History   Substance and Sexual Activity  Alcohol Use No     Social History   Substance and Sexual Activity  Drug Use No    Social History   Socioeconomic History   Marital status: Single    Spouse name: Not on  file   Number of children: Not on file   Years of education: Not on file   Highest education level: Not on file  Occupational History   Not on file  Tobacco Use   Smoking status: Every Day    Packs/day: .5    Types: Cigarettes   Smokeless tobacco: Never  Substance and Sexual Activity   Alcohol use: No   Drug use: No   Sexual activity: Not on file  Other Topics Concern   Not on file  Social History Narrative   Not on  file   Social Determinants of Health   Financial Resource Strain: Not on file  Food Insecurity: Food Insecurity Present (12/15/2022)   Hunger Vital Sign    Worried About Running Out of Food in the Last Year: Sometimes true    Ran Out of Food in the Last Year: Sometimes true  Transportation Needs: Unmet Transportation Needs (12/15/2022)   PRAPARE - Administrator, Civil Service (Medical): Yes    Lack of Transportation (Non-Medical): Yes  Physical Activity: Not on file  Stress: Not on file  Social Connections: Not on file   Sleep: Good  Appetite:  Good  Current Medications: Current Facility-Administered Medications  Medication Dose Route Frequency Provider Last Rate Last Admin   acetaminophen (TYLENOL) tablet 650 mg  650 mg Oral Q6H PRN Ardis Hughs, NP   650 mg at 12/17/22 0614   alum & mag hydroxide-simeth (MAALOX/MYLANTA) 200-200-20 MG/5ML suspension 30 mL  30 mL Oral Q4H PRN Ardis Hughs, NP       amLODipine (NORVASC) tablet 5 mg  5 mg Oral Daily Rex Kras, MD   5 mg at 12/17/22 4098   diphenhydrAMINE (BENADRYL) capsule 50 mg  50 mg Oral TID PRN Ardis Hughs, NP       Or   diphenhydrAMINE (BENADRYL) injection 50 mg  50 mg Intramuscular TID PRN Ardis Hughs, NP       FLUoxetine (PROZAC) capsule 20 mg  20 mg Oral Daily Armandina Stammer I, NP   20 mg at 12/17/22 1191   haloperidol (HALDOL) tablet 5 mg  5 mg Oral TID PRN Ardis Hughs, NP       Or   haloperidol lactate (HALDOL) injection 5 mg  5 mg Intramuscular TID PRN Ardis Hughs, NP       hydrOXYzine (ATARAX) tablet 25 mg  25 mg Oral TID PRN Ardis Hughs, NP       LORazepam (ATIVAN) tablet 2 mg  2 mg Oral TID PRN Ardis Hughs, NP       Or   LORazepam (ATIVAN) injection 2 mg  2 mg Intramuscular TID PRN Ardis Hughs, NP       magnesium hydroxide (MILK OF MAGNESIA) suspension 30 mL  30 mL Oral Daily PRN Ardis Hughs, NP       traZODone (DESYREL) tablet 50 mg   50 mg Oral QHS PRN Ardis Hughs, NP       Lab Results:  Results for orders placed or performed during the hospital encounter of 12/15/22 (from the past 48 hour(s))  TSH     Status: None   Collection Time: 12/17/22  6:20 AM  Result Value Ref Range   TSH 0.696 0.350 - 4.500 uIU/mL    Comment: Performed by a 3rd Generation assay with a functional sensitivity of <=0.01 uIU/mL. Performed at [facility] [address], [place], [place] [address]  Hemoglobin A1c     Status: None   Collection Time: 12/17/22  6:20 AM  Result Value Ref Range   Hgb A1c MFr Bld 5.6 4.8 - 5.6 %    Comment: (NOTE) Pre diabetes:          5.7%-6.4%  Diabetes:              >6.4%  Glycemic control for   <7.0% adults with diabetes    Mean Plasma Glucose 114.02 mg/dL    Comment: Performed at [facility], [address] [place], [place] [address]  Lipid panel     Status: None   Collection Time: 12/17/22  6:20 AM  Result Value Ref Range   Cholesterol 155 0 - 200 mg/dL   Triglycerides 213 <086 mg/dL   HDL 41 >57 mg/dL   Total CHOL/HDL Ratio 3.8 RATIO   VLDL 25 0 - 40 mg/dL   LDL Cholesterol 89 0 - 99 mg/dL    Comment:        Total Cholesterol/HDL:CHD Risk Coronary Heart Disease Risk Table                     Men   Women  1/2 Average Risk   3.4   3.3  Average Risk       5.0   4.4  2 X Average Risk   9.6   7.1  3 X Average Risk  23.4   11.0        Use the calculated Patient Ratio above and the CHD Risk Table to determine the patient's CHD Risk.        ATP III CLASSIFICATION (LDL):  <100     mg/dL   Optimal  846-962  mg/dL   Near or Above                    Optimal  130-159  mg/dL   Borderline  952-841  mg/dL   High  >324     mg/dL   Very High Performed at [facility], [address], [name], [place] [address]    Blood Alcohol level:  Lab Results  Component Value Date   ETH <10 12/15/2022   Metabolic Disorder Labs: Lab Results  Component  Value Date   HGBA1C 5.6 12/17/2022   MPG 114.02 12/17/2022   No results found for: "PROLACTIN" Lab Results  Component Value Date   CHOL 155 12/17/2022   TRIG 126 12/17/2022   HDL 41 12/17/2022   CHOLHDL 3.8 12/17/2022   VLDL 25 12/17/2022   LDLCALC 89 12/17/2022   LDLCALC 92 08/25/2009   Physical Findings: AIMS:0 CIWA:  n/a COWS:  n/a  Musculoskeletal: Strength & Muscle Tone: within normal limits Gait & Station: normal Patient leans: N/A  Psychiatric Specialty Exam:  Presentation  General Appearance:  Appropriate for Environment; Fairly Groomed  Eye Contact: Good  Speech: Clear and Coherent  Speech Volume: Normal  Handedness:No data recorded  Mood and Affect  Mood: Depressed  Affect: Congruent   Thought Process  Thought Processes: Coherent  Descriptions of Associations:Intact  Orientation:Full (Time, Place and Person)  Thought Content:Logical  History of Schizophrenia/Schizoaffective disorder:No data recorded Duration of Psychotic Symptoms:No data recorded Hallucinations:Hallucinations: None  Ideas of Reference:None  Suicidal Thoughts:Suicidal Thoughts: No  Homicidal Thoughts:Homicidal Thoughts: No   Sensorium  Memory: Immediate Fair; Recent Fair  Judgment: Fair  Insight: Fair   Art therapist  Concentration: Fair  Attention Span: Fair  Recall:  Fair  Fund of Knowledge: Fair  Language: Estate agent Activity:Psychomotor Activity: Normal   Assets  Assets: Communication Skills  Sleep  Sleep:Sleep: Good   Physical Exam: Physical Exam Constitutional:      Appearance: Normal appearance.  HENT:     Head: Normocephalic.     Nose: Nose normal.  Eyes:     Pupils: Pupils are equal, round, and reactive to light.  Musculoskeletal:     Cervical back: Normal range of motion.  Neurological:     Mental Status: He is alert and oriented to person, place, and time.    Review of  Systems  Constitutional: Negative.   HENT: Negative.    Eyes: Negative.   Respiratory: Negative.    Cardiovascular: Negative.   Gastrointestinal:  Negative for heartburn.  Genitourinary: Negative.   Musculoskeletal: Negative.   Skin: Negative.   Neurological:  Negative for dizziness.  Psychiatric/Behavioral:  Positive for depression and substance abuse. Negative for hallucinations, memory loss and suicidal ideas. The patient is nervous/anxious and has insomnia.    Blood pressure (!) 130/95, pulse 81, temperature 98.4 F (36.9 C), temperature source Oral, resp. rate 16, height 5\' 11"  (1.803 m), weight 78.9 kg, SpO2 99 %. Body mass index is 24.27 kg/m.  Treatment Plan Summary: Daily contact with patient to assess and evaluate symptoms and progress in treatment and Medication management.    Principal/active diagnoses. Major depressive disorder, single episode, severe.  Stimulant use disorder (cocaine).    Associated symptoms; Suicidal ideations.  Plan: The risks/benefits/side-effects/alternatives to the medications in use were discussed in detail with the patient and time was given for patient's questions. The patient consents to medication trial.  -Continue Fluoxetine 20 mg po Q daily for depressive symptoms  -Continue hydroxyzine 25 mg po tid prn for anxiety.  -Continue Trazodone 50 mg po Q hs prn for insomnia.   Agitation protocols: Cont as recommended;  -Benadryl 50 mg po or IM tid prn. -Haldol 5 mg po or IM tid prn.  -Lorazepam 2 mg po or IM tid prn.   Other PRNS -Continue Tylenol 650 mg every 6 hours PRN for mild pain -Continue Maalox 30 ml Q 4 hrs PRN for indigestion -Continue MOM 30 ml po Q 6 hrs for constipation   Safety and Monitoring: Voluntary admission to inpatient psychiatric unit for safety, stabilization and treatment Daily contact with patient to assess and evaluate symptoms and progress in treatment Patient's case to be discussed in multi-disciplinary team  meeting Observation Level : q15 minute checks Vital signs: q12 hours Precautions: Safety   Discharge Planning: Social work and case management to assist with discharge planning and identification of hospital follow-up needs prior to discharge Estimated LOS: 5-7 days Discharge Concerns: Need to establish a safety plan; Medication compliance and effectiveness Discharge Goals: Return home with outpatient referrals for mental health follow-up including medication management/psychotherapy   Observation Level/Precautions:  15 minute checks  Laboratory:   Per ED, current lab results reviewed, will obtain TSH, Lipid panel, Hgba1c..  Psychotherapy: Enrolled in the group sessions.   Medications:  See MAR.  Consultations: As needed.   Discharge Concerns: Safety, mood stability, maintaining sobriety.  Estimated LOS: 3-5 days.  Other: nA     Physician Treatment Plan for Primary Diagnosis: Major depressive disorder, single episode, severe (HCC)   Long Term Goal(s): Improvement in symptoms so as ready for discharge   Short Term Goals: Ability to identify changes in lifestyle to reduce recurrence of condition will improve,  Ability to verbalize feelings will improve, Ability to disclose and discuss suicidal ideas, and Ability to demonstrate self-control will improve   Physician Treatment Plan for Secondary Diagnosis: Principal Problem:   Major depressive disorder, single episode, severe (HCC) Active Problems:   Suicidal ideation   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      Starleen Blue, NP 12/17/2022, 4:30 PM

## 2022-12-17 NOTE — Group Note (Signed)
Recreation Therapy Group Note   Group Topic:Animal Assisted Therapy   Group Date: 12/17/2022 Start Time: 0945 End Time: 1031 Facilitators: Miata Culbreth-McCall, LRT,CTRS Location: [address] Dayroom   Animal-Assisted Activity (AAA) Program Checklist/Progress Notes Patient Eligibility Criteria Checklist & Daily Group note for Rec Tx Intervention  AAA/T Program Assumption of Risk Form signed by Patient/ or Parent Legal Guardian Yes  Patient understands his/her participation is voluntary Yes   Affect/Mood: N/A   Participation Level: Did not attend    Clinical Observations/Individualized Feedback:     Plan: Continue to engage patient in RT group sessions 2-3x/week.   Brian Daugherty, LRT,CTRS  12/17/2022 1:30 PM

## 2022-12-17 NOTE — Progress Notes (Signed)
Pt signed 72 hour request for discharge 12/17/22 at 1248. Provider notified at time of signing.

## 2022-12-17 NOTE — [facility] Suicide Risk Assessment (Signed)
[facility] INPATIENT:  Family/Significant Other Suicide Prevention Education  Suicide Prevention Education:  Education Completed; Brian Daugherty ( GF) [phone] ,  (name of family member/significant other) has been identified by the patient as the family member/significant other with whom the patient will be residing, and identified as the person(s) who will aid the patient in the event of a mental health crisis (suicidal ideations/suicide attempt).  With written consent from the patient, the family member/significant other has been provided the following suicide prevention education, prior to the and/or following the discharge of the patient.  Safety planning was completed with Girlfriend Brian Daugherty. Brian Daugherty confirmed that patient could come stay with her as long as he gets accepted and goes to [facility] to complete the 2 year program for his substance and alcohol use. Brian Daugherty states that patient does not have anyone but her and she provided him with [facility] number stating, " he not going to make it out here doing these drugs" . Brian Daugherty did say  that this will be patient third time, because he is in there system. First time, patient left because everyone was getting COVID and second time patient did not have a ride. Brian Daugherty denies any guns or weapons being in her home and that she will pick him up once he is released.   The suicide prevention education provided includes the following: Suicide risk factors Suicide prevention and interventions National Suicide Hotline telephone number [facility] assessment telephone number [facility] Emergency Assistance 911 [facility] and/or Residential Mobile Crisis Unit telephone number  Request made of family/significant other to: Remove weapons (e.g., guns, rifles, knives), all items previously/currently identified as safety concern.   Remove drugs/medications (over-the-counter, prescriptions, illicit drugs), all items previously/currently identified as a safety  concern.  The family member/significant other verbalizes understanding of the suicide prevention education information provided.  The family member/significant other agrees to remove the items of safety concern listed above.  Isabella Bowens 12/17/2022, 1:03 PM

## 2022-12-17 NOTE — Progress Notes (Signed)
   12/17/22 0800  Psych Admission Type (Psych Patients Only)  Admission Status Voluntary  Psychosocial Assessment  Patient Complaints Irritability  Eye Contact Brief  Facial Expression Flat  Affect Depressed  Speech Logical/coherent  Interaction Cautious  Motor Activity Slow  Appearance/Hygiene Unremarkable  Behavior Characteristics Cooperative;Appropriate to situation  Mood Depressed  Thought Process  Coherency WDL  Content WDL  Delusions None reported or observed  Perception WDL  Hallucination None reported or observed  Judgment Poor  Confusion None  Danger to Self  Current suicidal ideation? Denies  Agreement Not to Harm Self Yes  Description of Agreement verbal  Danger to Others  Danger to Others None reported or observed

## 2022-12-18 DIAGNOSIS — F322 Major depressive disorder, single episode, severe without psychotic features: Secondary | ICD-10-CM | POA: Diagnosis not present

## 2022-12-18 LAB — LIPID PANEL
Cholesterol: 164 mg/dL (ref 0–200)
HDL: 40 mg/dL — ABNORMAL LOW (ref >40)
LDL Cholesterol: 104 mg/dL — ABNORMAL HIGH (ref 0–99)
Total CHOL/HDL Ratio: 4.1 RATIO
Triglycerides: 101 mg/dL (ref <150)
VLDL: 20 mg/dL (ref 0–40)

## 2022-12-18 LAB — VITAMIN D 25 HYDROXY (VIT D DEFICIENCY, FRACTURES): Vit D, 25-Hydroxy: 25.24 ng/mL — ABNORMAL LOW (ref 30–100)

## 2022-12-18 NOTE — Progress Notes (Signed)
[facility] LCSW Progress Note   12/18/2022    2:54 PM  Cordarrius Coad   [id number]   Type of Contact and Topic:  [facility]  Patient states that he participated in [place] interview.  Patient states that he can go after medical clearance with back pain.  CSW spoke with patient and doctor regarding discharge plans.  CSW called [facility] and confirmed with them about acceptance.  They informed me that he has to fix charges with court first before he goes.  His next court date is July 15th.  At this time, his discharge plan is to return home to [facility] with appointment for [facility].      Signed:  Anson [place] MSW, LCSW, LCAS 12/18/2022 2:54 PM

## 2022-12-18 NOTE — Progress Notes (Signed)
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 Admitted to the [facility] from the [facility] hospital ED with complaint of suicidal ideations triggered by his addiction to cocaine that led to his homelessness. A review of his toxicology results has shown a BAL of <10, there were no UDS reports. Patient apparently was taken to the [facility] ED with complaint of muscle cramps to his leg areas induced by dehydration as a result of homelessness. After medical evaluation, stabilization & clearance, chart review reports showed that patient became tearful & expressed suicidal ideations with plans to cut his wrists. He was later transferred to the [facility] for further psychiatric evaluation/treatments.   24-hour chart review: Sleep and was not documented, but patient reports a good sleep quality last night.  DBP continues to stay in the 90s.   Only PRN medication given in the past 24 hrs has been Tylenol. Pt has not been engaged activities in the past 24 hrs, and has not been attending unit group sessions. He has presented with irritability as per nursing documentation.  Patient assessment note:Depression remains persistent as per objective assessment, but pt remains persistent on wanting to be discharged. He denies SI/HI/AVH, denies paranoia, denies delusional thoughts. He signed his 57-hour notice for discharge on 12/17/22 at 12:48 PM. He told writer that he will be residing with his GF,  but his GF has suggested that his stay there will only be for a few days as per CSW who spoke with girlfriend. Pt stated earlier in shift that after staying with his GF, he will go to [place], which is an inpatient rehab facility. Pt was allowed use of phone today to call this program, and they are wanting medical clearance for his back pain & also asking that he fixes charges with the court first prior to going, there. Next court date is not until 7/15 as per CSW.   He reports a good sleep quality last night, reports a good appetite, denies being in any physical pain, states he is tolerating medications well, denies issues with bowel movements. He is continuing  to complain of back pain, states it is a chronic condition, states Tylenol does not help, but that he takes Advil outside of the hospital environment. Agreeable to Naproxen 500 mg PRN for pain. We will continue other medications as listed below with no changes today. Pt's treatment team aware of his 72 hr notice for discharge, and also aware of his persistent demands for discharge. He seems to be craving substances of abuse, and his substance use, low socio economic status, lack of a support system, lack of employment and housing place him at a danger to self currently. We will evaluate on a day to day basis regarding the need to discharge, and decision will be made to discharge or IVC prior to the 72 hr notice expiring.   Labs reviewed: Ordered lipid panel test, and vitamin D levels.  Total Time spent with patient: 45 minutes  Past Psychiatric History: Se[facility] H &  P  Past Medical History:  Past Medical History:  Diagnosis Date   Glaucoma    Hyperlipemia    Hypertension     Past Surgical History:  Procedure Laterality Date   LEG SURGERY     LUNG SURGERY     Family History: History reviewed. No pertinent family history. Family Psychiatric  History: See H & P Social History:  Social History   Substance and Sexual Activity  Alcohol Use  No     Social History   Substance and Sexual Activity  Drug Use No    Social History   Socioeconomic History   Marital status: Single    Spouse name: Not on file   Number of children: Not on file   Years of education: Not on file   Highest education level: Not on file  Occupational History   Not on file  Tobacco Use   Smoking status: Every Day    Packs/day: .5    Types: Cigarettes   Smokeless tobacco: Never  Substance and Sexual Activity   Alcohol use: No   Drug use: No   Sexual activity: Not on file  Other Topics Concern   Not on file  Social History Narrative   Not on file   Social Determinants of Health   Financial Resource Strain: Not on file  Food Insecurity: Food Insecurity Present (12/15/2022)   Hunger Vital Sign    Worried About Running Out of Food in the Last Year: Sometimes true    Ran Out of Food in the Last Year: Sometimes true  Transportation Needs: Unmet Transportation Needs (12/15/2022)   PRAPARE - Administrator, Civil Service (Medical): Yes    Lack of Transportation (Non-Medical): Yes  Physical Activity: Not on file  Stress: Not on file  Social Connections: Not on file   Sleep: Good  Appetite:  Good  Current Medications: Current Facility-Administered Medications  Medication Dose Route Frequency Provider Last Rate Last Admin   acetaminophen (TYLENOL) tablet 650 mg  650 mg Oral Q6H PRN Ardis Hughs, NP   650 mg at 12/18/22 0810   alum & mag hydroxide-simeth (MAALOX/MYLANTA) 200-200-20 MG/5ML suspension 30 mL  30 mL Oral Q4H PRN Ardis Hughs, NP       amLODipine (NORVASC) tablet 5 mg  5 mg Oral Daily Rex Kras, MD   5 mg at 12/18/22 9562   diphenhydrAMINE (BENADRYL) capsule 50 mg  50 mg Oral TID PRN Ardis Hughs, NP       Or   diphenhydrAMINE (BENADRYL) injection 50 mg  50 mg Intramuscular TID PRN Ardis Hughs, NP       FLUoxetine (PROZAC) capsule 20 mg  20 mg Oral Daily Armandina Stammer I, NP   20 mg at  12/18/22 1308   haloperidol (HALDOL) tablet 5 mg  5 mg Oral TID PRN Ardis Hughs, NP       Or   haloperidol lactate (HALDOL) injection 5 mg  5 mg Intramuscular TID PRN Ardis Hughs, NP       hydrOXYzine (ATARAX) tablet 25 mg  25 mg Oral TID PRN Ardis Hughs, NP       LORazepam (ATIVAN) tablet 2 mg  2 mg Oral TID PRN Ardis Hughs, NP       Or   LORazepam (ATIVAN) injection 2 mg  2 mg Intramuscular TID PRN Ardis Hughs, NP       magnesium hydroxide (MILK OF MAGNESIA) suspension 30 mL  30 mL Oral Daily PRN Ardis Hughs, NP       traZODone (DESYREL) tablet 50 mg  50 mg Oral QHS PRN Ardis Hughs, NP       Lab Results:  Results for orders placed or performed during the hospital encounter of 12/15/22 (from the past 48 hour(s))  TSH     Status: None   Collection Time: 12/17/22  6:20 AM  Result Value Ref Range   TSH 0.696 0.350 - 4.500 uIU/mL    Comment: Performed by a 3rd Generation assay with a functional sensitivity of <=0.01 uIU/mL. Performed at [facility], [address], [place], [place] [address]   Hemoglobin A1c     Status: None   Collection Time: 12/17/22  6:20 AM  Result Value Ref Range   Hgb A1c MFr Bld 5.6 4.8 - 5.6 %    Comment: (NOTE) Pre diabetes:          5.7%-6.4%  Diabetes:              >6.4%  Glycemic control for   <7.0% adults with diabetes    Mean Plasma Glucose 114.02 mg/dL    Comment: Performed at [facility], [address], [place], [place] [address]  Lipid panel     Status: None   Collection Time: 12/17/22  6:20 AM  Result Value Ref Range   Cholesterol 155 0 - 200 mg/dL   Triglycerides 086 <578 mg/dL   HDL 41 >46 mg/dL   Total CHOL/HDL Ratio 3.8 RATIO   VLDL 25 0 - 40 mg/dL   LDL Cholesterol 89 0 - 99 mg/dL    Comment:        Total Cholesterol/HDL:CHD Risk Coronary Heart Disease Risk Table                     Men   Women  1/2 Average Risk   3.4   3.3  Average Risk       5.0   4.4  2  X Average Risk   9.6   7.1  3 X Average Risk  23.4   11.0        Use the calculated Patient Ratio above and the CHD Risk Table to determine the patient's CHD Risk.        ATP III CLASSIFICATION (LDL):  <100     mg/dL   Optimal  962-952  mg/dL   Near or Above                    Optimal  130-159  mg/dL   Borderline  841-324  mg/dL   High  >401     mg/dL   Very High Performed at [facility], [address] [place], [place] [address]   Lipid panel     Status: Abnormal   Collection Time: 12/18/22  6:22 AM  Result Value Ref Range   Cholesterol 164 0 - 200 mg/dL   Triglycerides 366 <440 mg/dL   HDL 40 (L) >34 mg/dL   Total CHOL/HDL Ratio 4.1 RATIO   VLDL 20 0 - 40 mg/dL   LDL Cholesterol 742 (H) 0 - 99 mg/dL    Comment:        Total Cholesterol/HDL:CHD Risk Coronary Heart Disease Risk Table                     Men   Women  1/2 Average Risk  3.4   3.3  Average Risk       5.0   4.4  2 X Average Risk   9.6   7.1  3 X Average Risk  23.4   11.0        Use the calculated Patient Ratio above and the CHD Risk Table to determine the patient's CHD Risk.        ATP III CLASSIFICATION (LDL):  <100     mg/dL   Optimal  604-540  mg/dL   Near or Above                    Optimal  130-159  mg/dL   Borderline  981-191  mg/dL   High  >478     mg/dL   Very High Performed at [facility], [address] [place], [place] [address]   VITAMIN D 25 Hydroxy (Vit-D Deficiency, Fractures)     Status: Abnormal   Collection Time: 12/18/22  6:22 AM  Result Value Ref Range   Vit D, 25-Hydroxy 25.24 (L) 30 - 100 ng/mL    Comment: (NOTE) Vitamin D deficiency has been defined by the Institute of Medicine  and an Endocrine Society practice guideline as a level of serum 25-OH  vitamin D less than 20 ng/mL (1,2). The Endocrine Society went on to  further define vitamin D insufficiency as a level between 21 and 29  ng/mL (2).  1. IOM (Institute of Medicine).  2010. Dietary reference intakes for  calcium and D. [place]: [employer]. 2. Holick MF, Binkley Caliente, Bischoff-Ferrari HA, et al. Evaluation,  treatment, and prevention of vitamin D deficiency: an Endocrine  Society clinical practice guideline, JCEM. 2011 Jul; 96(7): 1911-30.  Performed at [facility], [address] [place], [place] [address]    Blood Alcohol level:  Lab Results  Component Value Date   ETH <10 12/15/2022   Metabolic Disorder Labs: Lab Results  Component Value Date   HGBA1C 5.6 12/17/2022   MPG 114.02 12/17/2022   No results found for: "PROLACTIN" Lab Results  Component Value Date   CHOL 164 12/18/2022   TRIG 101 12/18/2022   HDL 40 (L) 12/18/2022   CHOLHDL 4.1 12/18/2022   VLDL 20 12/18/2022   LDLCALC 104 (H) 12/18/2022   LDLCALC 89 12/17/2022   Physical Findings: AIMS:0 CIWA:  n/a COWS:  n/a  Musculoskeletal: Strength & Muscle Tone: within normal limits Gait & Station: normal Patient leans: N/A  Psychiatric Specialty Exam:  Presentation  General Appearance:  Fairly Groomed  Eye Contact: Fair  Speech: Clear and Coherent  Speech Volume: Normal  Handedness:Right   Mood and Affect  Mood: Depressed  Affect: Congruent   Thought Process  Thought Processes: Coherent  Descriptions of Associations:Intact  Orientation:Full (Time, Place and Person)  Thought Content:Logical  History of Schizophrenia/Schizoaffective disorder:No data recorded Duration of Psychotic Symptoms:No data recorded Hallucinations:Hallucinations: None  Ideas of Reference:None  Suicidal Thoughts:Suicidal Thoughts: No  Homicidal Thoughts:Homicidal Thoughts: No   Sensorium  Memory: Immediate Good  Judgment: Poor  Insight: Poor   Executive Functions  Concentration: Poor  Attention Span: Poor  Recall: Fair  Fund of Knowledge: Poor  Language: Fair  Psychomotor Activity  Psychomotor  Activity:Psychomotor Activity: Normal   Assets  Assets: Resilience  Sleep  Sleep:Sleep: Good   Physical Exam: Physical Exam Constitutional:      Appearance: Normal appearance.  HENT:     Head: Normocephalic.  Nose: Nose normal.  Eyes:     Pupils: Pupils are equal, round, and reactive to light.  Musculoskeletal:     Cervical back: Normal range of motion.  Neurological:     Mental Status: He is alert and oriented to person, place, and time.    Review of Systems  Constitutional: Negative.   HENT: Negative.    Eyes: Negative.   Respiratory: Negative.    Cardiovascular: Negative.   Gastrointestinal:  Negative for heartburn.  Genitourinary: Negative.   Musculoskeletal: Negative.   Skin: Negative.   Neurological:  Negative for dizziness.  Psychiatric/Behavioral:  Positive for depression and substance abuse. Negative for hallucinations, memory loss and suicidal ideas. The patient is nervous/anxious and has insomnia.    Blood pressure (!) 125/95, pulse 96, temperature 98.4 F (36.9 C), temperature source Oral, resp. rate 16, height 5\' 11"  (1.803 m), weight 78.9 kg, SpO2 99 %. Body mass index is 24.27 kg/m.  Treatment Plan Summary: Daily contact with patient to assess and evaluate symptoms and progress in treatment and Medication management.    Principal/active diagnoses. Major depressive disorder, single episode, severe.  Stimulant use disorder (cocaine).    Associated symptoms; Suicidal ideations.  Plan: The risks/benefits/side-effects/alternatives to the medications in use were discussed in detail with the patient and time was given for patient's questions. The patient consents to medication trial.  -Continue Fluoxetine 20 mg po Q daily for depressive symptoms  -Continue hydroxyzine 25 mg po tid prn for anxiety.  -Continue Trazodone 50 mg po Q hs prn for insomnia.   Agitation protocols: Cont as recommended;  -Benadryl 50 mg po or IM tid prn. -Haldol 5 mg po or  IM tid prn.  -Lorazepam 2 mg po or IM tid prn.   Other PRNS -Start Naproxen 500 mg PRN BID for back pain -Continue Tylenol 650 mg every 6 hours PRN for mild pain -Continue Maalox 30 ml Q 4 hrs PRN for indigestion -Continue MOM 30 ml po Q 6 hrs for constipation   Safety and Monitoring: Voluntary admission to inpatient psychiatric unit for safety, stabilization and treatment Daily contact with patient to assess and evaluate symptoms and progress in treatment Patient's case to be discussed in multi-disciplinary team meeting Observation Level : q15 minute checks Vital signs: q12 hours Precautions: Safety   Discharge Planning: Social work and case management to assist with discharge planning and identification of hospital follow-up needs prior to discharge Estimated LOS: 5-7 days Discharge Concerns: Need to establish a safety plan; Medication compliance and effectiveness Discharge Goals: Return home with outpatient referrals for mental health follow-up including medication management/psychotherapy   Observation Level/Precautions:  15 minute checks  Laboratory:   Per ED, current lab results reviewed, will obtain TSH, Lipid panel, Hgba1c..  Psychotherapy: Enrolled in the group sessions.   Medications:  See MAR.  Consultations: As needed.   Discharge Concerns: Safety, mood stability, maintaining sobriety.  Estimated LOS: 3-5 days.  Other: nA     Physician Treatment Plan for Primary Diagnosis: Major depressive disorder, single episode, severe (HCC)   Long Term Goal(s): Improvement in symptoms so as ready for discharge   Short Term Goals: Ability to identify changes in lifestyle to reduce recurrence of condition will improve, Ability to verbalize feelings will improve, Ability to disclose and discuss suicidal ideas, and Ability to demonstrate self-control will improve   Physician Treatment Plan for Secondary Diagnosis: Principal Problem:   Major depressive disorder, single episode,  severe (HCC) Active Problems:   Suicidal ideation  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      Starleen Blue, NP 12/18/2022, 3:25 PMPatient ID: Brian Daugherty, male   DOB: 1965-12-03, 57 y.o.   MRN: [id number]

## 2022-12-18 NOTE — Group Note (Signed)
Recreation Therapy Group Note   Group Topic:Stress Management  Group Date: 12/18/2022 Start Time: 0935 End Time: 1000 Facilitators: Slayde Brault-McCall, LRT,CTRS Location: [address] Dayroom   Goal Area(s) Addresses:  Patient will actively participate in stress management techniques presented during session.  Patient will successfully identify benefit of practicing stress management post d/c.   Group Description: Guided Imagery. LRT provided education, instruction, and demonstration on practice of visualization via guided imagery. Patient was asked to participate in the technique introduced during session. LRT debriefed including topics of mindfulness, stress management and specific scenarios each patient could use these techniques. Patients were given suggestions of ways to access scripts post d/c and encouraged to explore Youtube and other apps available on smartphones, tablets, and computers.   Affect/Mood: N/A   Participation Level: Did not attend    Clinical Observations/Individualized Feedback:     Plan: Continue to engage patient in RT group sessions 2-3x/week.   Shanta Dorvil-McCall, LRT,CTRS 12/18/2022 11:57 AM

## 2022-12-18 NOTE — [facility] Group Notes (Signed)
Psychoeducational Group Note  Date:  12/18/2022 Time:  2000  Group Topic/Focus:  Narcotics Anonymous Meeting.   Participation Level: Did Not Attend  Participation Quality:  Not Applicable  Affect:  Not Applicable  Cognitive:  Not Applicable  Insight:  Not Applicable  Engagement in Group: Not Applicable  Additional Comments:  Did Not Attend.   Johann Capers S 12/18/2022, 10:30 PM

## 2022-12-18 NOTE — Progress Notes (Signed)
Adult Psychoeducational Group Note  Date:  12/18/2022 Time:  1:44 AM  Group Topic/Focus:  Wrap-Up Group:   The focus of this group is to help patients review their daily goal of treatment and discuss progress on daily workbooks.  Participation Level:  Did Not Attend  Participation Quality:  Appropriate  Affect:  Appropriate  Cognitive:  Appropriate  Insight: Appropriate  Engagement in Group:  No  Modes of Intervention:  Discussion  Additional Comments:  Pt. Did not attend the group.                                                                            Joselyn Arrow 12/18/2022, 1:44 AM

## 2022-12-18 NOTE — Progress Notes (Signed)
   12/18/22 0900  Psych Admission Type (Psych Patients Only)  Admission Status Voluntary  Psychosocial Assessment  Patient Complaints Anxiety;Irritability  Eye Contact Brief  Facial Expression Flat  Affect Depressed  Speech Logical/coherent  Interaction Cautious  Motor Activity Slow  Appearance/Hygiene Unremarkable  Behavior Characteristics Appropriate to situation  Mood Anxious;Depressed  Thought Process  Coherency WDL  Content WDL  Delusions None reported or observed  Perception WDL  Hallucination None reported or observed  Judgment Poor  Confusion None  Danger to Self  Current suicidal ideation? Denies  Agreement Not to Harm Self Yes  Description of Agreement verbal  Danger to Others  Danger to Others None reported or observed

## 2022-12-18 NOTE — Progress Notes (Signed)
   12/18/22 2133  Psych Admission Type (Psych Patients Only)  Admission Status Voluntary  Psychosocial Assessment  Patient Complaints Insomnia  Eye Contact Brief  Facial Expression Flat  Affect Depressed  Speech Logical/coherent  Interaction Cautious;Isolative  Motor Activity Slow  Appearance/Hygiene Unremarkable  Behavior Characteristics Cooperative;Appropriate to situation  Mood Depressed;Anxious;Sad  Thought Process  Content WDL  Delusions None reported or observed  Perception WDL  Hallucination None reported or observed  Judgment Poor  Confusion None  Danger to Self  Current suicidal ideation? Denies  Agreement Not to Harm Self Yes  Description of Agreement verbal  Danger to Others  Danger to Others None reported or observed

## 2022-12-19 DIAGNOSIS — F322 Major depressive disorder, single episode, severe without psychotic features: Secondary | ICD-10-CM | POA: Diagnosis not present

## 2022-12-19 MED ORDER — FLUOXETINE HCL 20 MG PO CAPS: 20.0000 mg | ORAL_CAPSULE | Freq: Every day | ORAL | 0 refills | Status: DC

## 2022-12-19 MED ORDER — AMLODIPINE BESYLATE 5 MG PO TABS: 5.0000 mg | ORAL_TABLET | Freq: Every day | ORAL | 0 refills | Status: DC

## 2022-12-19 MED ORDER — TRAZODONE HCL 50 MG PO TABS: 50.0000 mg | ORAL_TABLET | Freq: Every evening | ORAL | 0 refills | Status: DC | PRN

## 2022-12-19 NOTE — [facility] Suicide Risk Assessment (Signed)
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 Admitted to the [facility] from the [facility] hospital ED with complaint of suicidal ideations triggered by his addiction to cocaine that led to his homelessness. A review of his toxicology results has shown a BAL of <10, there were no UDS reports. Patient apparently was taken to the [facility] ED with complaint of muscle cramps to his leg areas induced by dehydration as a result of homelessness. After medical evaluation, stabilization & clearance, chart review reports showed that patient became tearful & expressed suicidal ideations with plans to cut his wrists. He was later transferred to the [facility] for further psychiatric evaluation/treatments.   PTA Medications:  Medications Prior to Admission  Medication Sig Dispense Refill Last Dose   amLODipine (NORVASC) 5 MG tablet Take 1 tablet by mouth daily.     Past Month   sildenafil (VIAGRA) 25 MG tablet Take 25 mg by mouth as needed. (Patient not taking: Reported on 12/16/2022)     Not Taking   tamsulosin (FLOMAX) 0.4 MG CAPS capsule Take 0.4 mg by mouth daily. (Patient not taking: Reported on 12/16/2022)     Not Taking    Hospital Course:   During the patient's hospitalization, patient had extensive initial psychiatric evaluation, and follow-up psychiatric evaluations every day.  Psychiatric diagnoses provided upon initial assessment: Major depressive  disorder, single episode, severe.  Stimulant use disorder (cocaine).   Patient's psychiatric medications were adjusted on admission: -Initiated Fluoxetine 10 mg po x one dose today.  -Fluoxetine 20 mg po Q daily (start on 12-17-22). -Continue hydroxyzine 25 mg po tid prn for anxiety.  -Continue Trazodone 50 mg po Q hs prn for insomnia.  During the hospitalization, other adjustments were made to the patient's psychiatric medication regimen: Patient was restarted on Norvasc 5 mg daily home medication for hypertension  Patient's care was discussed during the interdisciplinary team meeting every day during the hospitalization.  The patient denied having side effects to prescribed psychiatric medication.  Gradually, patient started adjusting to milieu. The patient was evaluated each day by a clinical provider to ascertain response to treatment. Improvement was noted by the patient's report of decreasing symptoms, improved sleep and appetite, affect, medication tolerance, behavior, and participation in unit programming.  Patient was asked each day to complete a self inventory noting mood, mental status, pain, new symptoms, anxiety and concerns.    Symptoms were reported as significantly decreased or resolved completely by discharge.   On day of discharge, patient was evaluated on 6/27 the patient reports that their mood is stable. The patient denied having suicidal thoughts for more than 48 hours prior to discharge.  Patient denies having homicidal thoughts.  Patient denies having auditory hallucinations.  Patient denies any visual hallucinations or other symptoms of psychosis. The patient was motivated to continue taking medication with a goal of continued improvement in mental health.  He was able to discuss coping skills with stressors as well as crisis plan if  worsening depression or recurring SI after discharge.  Patient agreed to comply with recommendation to abstain completely from cocaine use  after discharge.  Patient agreed to continue to follow with [facility] rehab program after discharge for possible admission there later in July after clearing his court date, he also agreed to follow-up with [facility] after discharge as well as outpatient follow-up with psychiatric provider for medication management and individual counseling to address stressors and coping skills. The patient reports their target psychiatric symptoms of depression and SI responded well to the psychiatric medications, and the patient reports overall benefit other psychiatric hospitalization. Supportive psychotherapy was provided to the patient. The patient also participated in regular group therapy while hospitalized. Coping skills, problem solving as well as relaxation therapies were also part of the unit programming.  Labs were reviewed with the patient, and abnormal results were discussed with the patient.  The patient is able to verbalize their individual safety plan to this provider.  Behavioral Events: None  Restraints: None  Groups: Attended and participated  Medications Changes: As above   Sleep  Sleep:Sleep: Good   Musculoskeletal: Strength & Muscle Tone: within normal limits Gait & Station: normal Patient leans: N/A  Psychiatric Specialty Exam  General Appearance: appears at stated age, fairly dressed and groomed  Behavior: pleasant and cooperative  Psychomotor Activity:No psychomotor agitation or retardation noted   Eye Contact: good Speech: normal amount, tone, volume and latency   Mood: euthymic Affect: congruent, pleasant and interactive  Thought Process: linear, goal directed, no circumstantial or tangential thought process noted, no racing thoughts or flight of ideas Descriptions of Associations: intact Thought Content: Hallucinations: denies AH, VH , does not appear responding to stimuli Delusions: No paranoia or other delusions noted Suicidal Thoughts: denies SI, intention, plan   Homicidal Thoughts: denies HI, intention, plan   Alertness/Orientation: alert and fully oriented  Insight: fair, improved Judgment: fair, improved  Memory: intact  Executive Functions  Concentration: intact  Attention Span: Fair Recall: intact Fund of Knowledge: fair   Art therapist  Concentration: intact Attention Span: Fair Recall: intact Fund of Knowledge: fair   Assets  Assets: Resilience   Physical Exam: Physical Exam ROS Blood pressure 108/88, pulse 98, temperature 98.4 F (36.9 C), temperature source Oral, resp. rate 20, height 5\' 11"  (1.803 m), weight 78.9 kg, SpO2 99 %. Body mass index is 24.27 kg/m.  Mental Status Per Nursing Assessment::   On Admission:  Suicidal ideation indicated by patient  Demographic Factors:  Male and Unemployed  Loss Factors: Legal issues  Historical Factors: NA  Risk Reduction Factors:   Positive social support and Positive therapeutic relationship  Continued Clinical Symptoms: Improved significantly during hospital stay Depression:   Anhedonia Impulsivity Insomnia  Cognitive Features That Contribute To Risk:  None    Suicide Risk:  Minimal: No identifiable suicidal ideation.  Patients presenting with no risk factors but with morbid ruminations; may be classified as minimal risk based on the severity of the depressive symptoms   Follow-up Information     Center, [facility]. Schedule an appointment as soon as possible for a visit.   Why: Please call this provider to personally schedule an appointment for therapy services. Contact information: [address] [place], [place] [address] [phone]         [facility] Go on 01/07/2023.   Why: You have an appointment for medication management services on 01/07/23 at 2:00 pm.  This appointment will be held  in person. Contact information: [address] [place] [address] [phone]          [facility] Go on 12/27/2022.   Specialty: Behavioral Health Why: You have an assessment appointment on 12/27/22 at  10:00 am  for the SAIOP (substance abuse intensive outpatient services).  * Please give 24 notice if you need to cancel or reschedule this appt. Contact information: [address] [place] [address] [phone]                 Plan Of Care/Follow-up recommendations:    Discharge recommendations:     Activity: as tolerated  Diet: heart healthy  # It is recommended to the patient to continue psychiatric medications as prescribed, after discharge from the hospital.     # It is recommended to the patient to follow up with your outpatient psychiatric provider and PCP.   # It was discussed with the patient, the impact of alcohol, drugs, tobacco have been there overall psychiatric and medical wellbeing, and total abstinence from substance use was recommended the patient.ed.   # Prescriptions provided or sent directly to preferred pharmacy at discharge. Patient agreeable to plan. Given opportunity to ask questions. Appears to feel comfortable with discharge.    # In the event of worsening symptoms, the patient is instructed to call the crisis hotline, 911 and or go to the nearest ED for appropriate evaluation and treatment of symptoms. To follow-up with primary care provider for other medical issues, concerns and or health care needs   # Patient was discharged home with a plan to follow up as noted above.  -Follow-up with outpatient primary care doctor and other specialists -for management of chronic medical disease, including: Patient was recommended to follow-up with primary care provider for long-term management of hypertension as well as history of reported chronic pain which was managed inpatient using Tylenol as needed.   Patient agrees with D/C instructions and plan.  The patient received suicide prevention pamphlet:  Yes Belongings  returned:  Clothing and Valuables  Total Time Spent in Direct Patient Care:  I personally spent 45 minutes on the unit in direct patient care. The direct patient care time included face-to-face time with the patient, reviewing the patient's chart, communicating with other professionals, and coordinating care. Greater than 50% of this time was spent in counseling or coordinating care with the patient regarding goals of hospitalization, psycho-education, and discharge planning needs.   Taylan Mayhan 12/19/2022, 10:25 AM

## 2022-12-19 NOTE — Progress Notes (Signed)
   12/19/22 0930  Psych Admission Type (Psych Patients Only)  Admission Status Voluntary  Psychosocial Assessment  Patient Complaints None  Eye Contact Fair  Facial Expression Flat;Sad  Affect Depressed;Flat  Speech Logical/coherent  Interaction Assertive  Motor Activity Other (Comment) (wdl)  Appearance/Hygiene Unremarkable  Behavior Characteristics Cooperative;Appropriate to situation  Mood Depressed;Sad  Thought Process  Coherency WDL  Content WDL  Delusions None reported or observed  Perception WDL  Hallucination None reported or observed  Judgment Impaired  Confusion None  Danger to Self  Current suicidal ideation? Denies  Agreement Not to Harm Self Yes  Description of Agreement Pt verbally contracts for  safety  Danger to Others  Danger to Others None reported or observed

## 2022-12-19 NOTE — Progress Notes (Signed)
Patient discharged from [facility] on 12/19/22 at 1200. Patient denies SI, plan, and intention. Suicide safety plan completed, reviewed with this RN, given to the patient, and a copy in the chart. Patient denies HI/AVH upon discharge. Patient is alert, oriented, and cooperative. RN provided patient with discharge paperwork and reviewed information with patient. Patient expressed that he understood all of the discharge instructions. Pt was satisfied with belongings returned to him from the locker and at bedside. Discharged patient to [facility] waiting room. Pt's girlfriend awaiting patient in the [facility] waiting room.

## 2022-12-19 NOTE — Progress Notes (Signed)
CSW spoke pt's girlfriend, Della Homan [phone] who reported pt can return to her home upon discharge. Vicky Melendrez will transport upon discharge. Treatment team made aware.

## 2022-12-19 NOTE — Progress Notes (Signed)
   12/19/22 0556  15 Minute Checks  Location Bedroom  Visual Appearance Calm  Behavior Sleeping  Sleep (Behavioral Health Patients Only)  Calculate sleep? (Click Yes once per 24 hr at 0600 safety check) Yes  Documented sleep last 24 hours 8.5

## 2022-12-19 NOTE — Progress Notes (Addendum)
  [facility] Adult Case Management Discharge Plan :  Will you be returning to the same living situation after discharge:  Yes,  pt will be returning home with girlfriend, Jaiveon Suppes [phone] At discharge, do you have transportation home?: Yes,  pt will be transported by girlfriend Do you have the ability to pay for your medications: Yes,  pt has active medical coverage  Release of information consent forms completed and in the chart;  Patient's signature needed at discharge.  Patient to Follow up at:  Follow-up Information     Center, [facility]. Schedule an appointment as soon as possible for a visit.   Why: Please call this provider to personally schedule an appointment for therapy services. Contact information: [address] [place] [place], [place] [address] [phone]         [facility] Go on 01/07/2023.   Why: You have an appointment for medication management services on 01/07/23 at 2:00 pm.  This appointment will be held in person. Contact information: [address] [place] [address] [phone]         [facility]. Go on 12/27/2022.   Specialty: Behavioral Health Why: You have an assessment appointment on 12/27/22 at  10:00 am  for the SAIOP (substance abuse intensive outpatient services).  * Please give 24 notice if you need to cancel or reschedule this appt. Contact information: [address] [place] [address] [phone]                 Next level of care provider has access to [facility] Link:no  Safety Planning and Suicide Prevention discussed: Yes,  SPE discussed and pamphlet will given at the time of discharge.     Has patient been referred to the Quitline?: Patient refused referral for treatment however has requested information for Quit Line for Tobacco cessation.  Patient has been referred for addiction treatment: Patient refused referral for treatment; referral  information given to patient at discharge.  Yasuko Lapage, Candace Cruise, LCSW 12/19/2022, 10:43 AM

## 2022-12-19 NOTE — Discharge Summary (Signed)
Physician Discharge Summary Note  Patient:  Brian Daugherty is an 57 y.o., male MRN:  [id number] DOB:  10-Dec-1965 Patient phone:  [phone] (home)  Patient address:   [address] [place] [address],  Total Time spent with patient: 45 minutes  Date of Admission:  12/15/2022 Date of Discharge: 12/19/2022  Reason for Admission:  This is the first psychiatric admission in this [facility] for this 57 year old AA male with hx of stimulant (cocaine) & alcohol use disorders. Admitted to the [facility] from the [facility] hospital ED with complaint of suicidal ideations triggered by his addiction to cocaine that led to his homelessness. A review of his toxicology results has shown a BAL of <10, there were no UDS reports. Patient apparently was taken to the [facility] ED with complaint of muscle cramps to his leg areas induced by dehydration as a result of homelessness. After medical evaluation, stabilization & clearance, chart review reports showed that patient became tearful & expressed suicidal ideations with plans to cut his wrists. He was later transferred to the [facility] for further psychiatric evaluation/treatments.   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  No history of suicide attempts reported.  Past Medical History:  Past Medical History:  Diagnosis Date   Glaucoma    Hyperlipemia    Hypertension     Past Surgical History:  Procedure Laterality Date   LEG SURGERY     LUNG SURGERY      Family History: History reviewed. No pertinent  family history. Family Psychiatric  History:  Patient currently denies any familial hx of mental illnesses.  Social History:  Social History   Substance and Sexual Activity  Alcohol Use No     Social History   Substance and Sexual Activity  Drug Use No    Social History   Socioeconomic History   Marital status: Single    Spouse name: Not on file   Number of children: Not on file   Years of education: Not on file   Highest education level: Not on file  Occupational History   Not on file  Tobacco Use   Smoking status: Every Day    Packs/day: .5    Types: Cigarettes   Smokeless tobacco: Never  Substance and Sexual Activity   Alcohol use: No   Drug use: No   Sexual activity: Not on file  Other Topics Concern   Not on file  Social History Narrative   Not on file   Social Determinants of Health   Financial Resource Strain: Not on file  Food Insecurity: Food Insecurity Present (12/15/2022)   Hunger Vital Sign    Worried About Running Out of Food in the Last Year: Sometimes true    Ran Out of Food in the Last Year: Sometimes true  Transportation Needs: Unmet Transportation Needs (12/15/2022)   PRAPARE - Administrator, Civil Service (Medical): Yes    Lack of Transportation (Non-Medical): Yes  Physical Activity: Not on file  Stress: Not on file  Social Connections: Not on file   Single, has no children, unemployed, homeless in [place], [place] area.  During hospital stay patient reported his girlfriend lives in [place] and is supportive, agreed for him to go stay with her after discharge. Substance Use History:  Reported using cocaine for about 5 years average 2-3 times monthly, denies other illicit drug use or alcohol use prior to admission.  Hospital Course:   During the patient's hospitalization, patient had extensive initial psychiatric evaluation, and follow-up psychiatric evaluations every day.   Psychiatric diagnoses provided upon initial  assessment: Major depressive disorder, single episode, severe.  Stimulant use disorder (cocaine).    Patient's psychiatric medications were adjusted on admission: -Initiated Fluoxetine 10 mg po x one dose today.  -Fluoxetine 20 mg po Q daily (start on 12-17-22). -Continue hydroxyzine 25 mg po tid prn for anxiety.  -Continue Trazodone 50 mg po Q hs prn for insomnia.   During the hospitalization, other adjustments were made to the patient's psychiatric medication regimen: Patient was restarted on Norvasc 5 mg daily home medication for hypertension   Patient's care was discussed during the interdisciplinary team meeting every day during the hospitalization.   The patient denied having side effects to prescribed psychiatric medication.   Gradually, patient started adjusting to milieu. The patient was evaluated each day by a clinical provider to ascertain response to treatment. Improvement was noted by the patient's report of decreasing symptoms, improved sleep and appetite, affect, medication tolerance, behavior, and participation in unit programming.  Patient was asked each day to complete a self inventory noting mood, mental status, pain, new symptoms, anxiety and concerns.     Symptoms were reported as significantly decreased or resolved completely by discharge.    On day of discharge, patient was evaluated on 6/27 the patient reports that their mood is stable. The patient denied having suicidal thoughts for more than 48 hours prior to discharge.  Patient denies having homicidal thoughts.  Patient denies having auditory hallucinations.  Patient denies any visual hallucinations or other symptoms of psychosis. The patient was motivated to continue taking medication with a goal of continued improvement in mental health.  He was able to discuss coping skills with stressors as well as crisis plan if worsening depression or recurring SI after discharge.  Patient agreed to comply with recommendation to  abstain completely from cocaine use after discharge.  Patient agreed to continue to follow with [facility] program after discharge for possible admission there later in July after clearing his court date, he also agreed to follow-up with [facility] after discharge as well as outpatient follow-up with psychiatric provider for medication management and individual counseling to address stressors and coping skills. The patient reports their target psychiatric symptoms of depression and SI responded well to the psychiatric medications, and the patient reports overall benefit other psychiatric hospitalization. Supportive psychotherapy was provided to the patient. The patient also participated in regular group therapy while hospitalized. Coping skills, problem solving as well as relaxation therapies were also part of the unit programming.   Labs were reviewed with the patient, and abnormal results were discussed with the patient.   The patient is able to verbalize their individual safety plan to this provider.   Behavioral Events: None   Restraints: None   Groups: Attended and participated   Medications Changes: As above     Sleep  Sleep:Sleep: Good  Physical Findings: AIMS:  , ,  ,  ,    CIWA:    COWS:     Musculoskeletal: Strength & Muscle Tone: within normal limits Gait & Station: normal Patient leans:  N/A   Psychiatric Specialty Exam:  General Appearance: appears at stated age, fairly dressed and groomed  Behavior: pleasant and cooperative  Psychomotor Activity:No psychomotor agitation or retardation noted   Eye Contact: good Speech: normal amount, tone, volume and latency   Mood: euthymic Affect: congruent, pleasant and interactive  Thought Process: linear, goal directed, no circumstantial or tangential thought process noted, no racing thoughts or flight of ideas Descriptions of Associations: intact Thought Content: Hallucinations: denies AH, VH , does not appear responding  to stimuli Delusions: No paranoia or other delusions noted Suicidal Thoughts: denies SI, intention, plan  Homicidal Thoughts: denies HI, intention, plan   Alertness/Orientation: alert and fully oriented  Insight: fair, improved Judgment: fair, improved  Memory: intact  Executive Functions  Concentration: intact  Attention Span: Fair Recall: intact Fund of Knowledge: fair   Assets  Assets: Resilience   Sleep Sleep: Sleep: Good    Physical Exam:  Physical Exam Vitals and nursing note reviewed.  Constitutional:      Appearance: Normal appearance.  HENT:     Head: Normocephalic and atraumatic.     Nose: Nose normal.  Eyes:     Pupils: Pupils are equal, round, and reactive to light.  Pulmonary:     Effort: Pulmonary effort is normal.  Musculoskeletal:        General: Normal range of motion.     Cervical back: Normal range of motion.  Neurological:     General: No focal deficit present.     Mental Status: He is alert and oriented to person, place, and time.  Psychiatric:        Mood and Affect: Mood normal.        Behavior: Behavior normal.        Thought Content: Thought content normal.        Judgment: Judgment normal.    Review of Systems  All other systems reviewed and are negative.  Blood pressure 108/88, pulse 98, temperature 98.4 F (36.9 C), temperature source Oral, resp. rate 20, height 5\' 11"  (1.803 m), weight 78.9 kg, SpO2 99 %. Body mass index is 24.27 kg/m.   Social History   Tobacco Use  Smoking Status Every Day   Packs/day: .5   Types: Cigarettes  Smokeless Tobacco Never   Tobacco Cessation:  N/A, patient does not currently use tobacco products   Blood Alcohol level:  Lab Results  Component Value Date   ETH <10 12/15/2022    Metabolic Disorder Labs:  Lab Results  Component Value Date   HGBA1C 5.6 12/17/2022   MPG 114.02 12/17/2022   No results found for: "PROLACTIN" Lab Results  Component Value Date   CHOL 164  12/18/2022   TRIG 101 12/18/2022   HDL 40 (L) 12/18/2022   CHOLHDL 4.1 12/18/2022   VLDL 20 12/18/2022   LDLCALC 104 (H) 12/18/2022   LDLCALC 89 12/17/2022    See Psychiatric Specialty Exam and Suicide Risk Assessment completed by Attending Physician prior to discharge.  Discharge destination:  Home with girlfriend  Is patient on multiple antipsychotic therapies at discharge:  No   Has Patient had three or more failed trials of antipsychotic monotherapy by history:  No  Recommended Plan for Multiple Antipsychotic Therapies: NA  Discharge Instructions     Diet - low sodium heart healthy   Complete by: As directed    Increase activity slowly   Complete by: As directed       Allergies as of 12/19/2022   No Known  Allergies      Medication List     STOP taking these medications    sildenafil 25 MG tablet Commonly known as: VIAGRA   tamsulosin 0.4 MG Caps capsule Commonly known as: FLOMAX       TAKE these medications      Indication  amLODipine 5 MG tablet Commonly known as: NORVASC Take 1 tablet (5 mg total) by mouth daily.  Indication: High Blood Pressure Disorder   FLUoxetine 20 MG capsule Commonly known as: PROZAC Take 1 capsule (20 mg total) by mouth daily. Start taking on: December 20, 2022  Indication: Major Depressive Disorder   traZODone 50 MG tablet Commonly known as: DESYREL Take 1 tablet (50 mg total) by mouth at bedtime as needed for sleep.  Indication: Trouble Sleeping        Follow-up Information     Center[facility]. Schedule an appointment as soon as possible for a visit.   Why: Please call this provider to personally schedule an appointment for therapy services. Contact information: [address] [place], [place] [address] [phone]         [facility] Go on 01/07/2023.   Why: You have an appointment for medication management services on 01/07/23 at 2:00 pm.  This  appointment will be held in person. Contact information: [address] [place] [address] [phone]         [facility]. Go on 12/27/2022.   Specialty: Behavioral Health Why: You have an assessment appointment on 12/27/22 at  10:00 am  for the SAIOP (substance abuse intensive outpatient services).  * Please give 24 notice if you need to cancel or reschedule this appt. Contact information: [address] [place] [address] [phone]                 Discharge recommendations:    Activity: as tolerated  Diet: heart healthy  # It is recommended to the patient to continue psychiatric medications as prescribed, after discharge from the hospital.     # It is recommended to the patient to follow up with your outpatient psychiatric provider and PCP.   # It was discussed with the patient, the impact of alcohol, drugs, tobacco have been there overall psychiatric and medical wellbeing, and total abstinence from substance use was recommended the patient.ed.   # Prescriptions provided or sent directly to preferred pharmacy at discharge. Patient agreeable to plan. Given opportunity to ask questions. Appears to feel comfortable with discharge.    # In the event of worsening symptoms, the patient is instructed to call the crisis hotline, 911 and or go to the nearest ED for appropriate evaluation and treatment of symptoms. To follow-up with primary care provider for other medical issues, concerns and or health care needs   # Patient was discharged home with a plan to follow up as noted above.  -Follow-up with outpatient primary care doctor and other specialists -for management of chronic medical disease, including:  Patient was recommended to follow-up with primary care provider for long-term management of hypertension as well as history of reported chronic pain which was managed inpatient using Tylenol as needed.    Patient agrees with D/C  instructions and plan.   The patient received suicide prevention pamphlet:  Yes Belongings returned:  Clothing and Valuables  Total Time Spent in Direct Patient Care:  I personally spent 45 minutes on the unit in direct patient care. The direct  patient care time included face-to-face time with the patient, reviewing the patient's chart, communicating with other professionals, and coordinating care. Greater than 50% of this time was spent in counseling or coordinating care with the patient regarding goals of hospitalization, psycho-education, and discharge planning needs.    SignedSarita Bottom, MD 12/19/2022, 10:32 AM

## 2023-07-27 ENCOUNTER — Emergency Department (HOSPITAL_COMMUNITY)
Admission: EM | Admit: 2023-07-27 | Discharge: 2023-07-28 | Disposition: A | Discharge status: Other Acute Inpatient Hospital | Payer: BLUE CROSS/BLUE SHIELD | Attending: Emergency Medicine | Admitting: Emergency Medicine

## 2023-07-27 ENCOUNTER — Other Ambulatory Visit: Payer: Self-pay

## 2023-07-27 ENCOUNTER — Encounter (HOSPITAL_COMMUNITY): Payer: Self-pay

## 2023-07-27 ENCOUNTER — Emergency Department (HOSPITAL_BASED_OUTPATIENT_CLINIC_OR_DEPARTMENT_OTHER): Payer: BLUE CROSS/BLUE SHIELD

## 2023-07-27 DIAGNOSIS — R45851 Suicidal ideations: Secondary | ICD-10-CM | POA: Insufficient documentation

## 2023-07-27 DIAGNOSIS — F1420 Cocaine dependence, uncomplicated: Secondary | ICD-10-CM | POA: Insufficient documentation

## 2023-07-27 DIAGNOSIS — M79661 Pain in right lower leg: Secondary | ICD-10-CM | POA: Diagnosis not present

## 2023-07-27 DIAGNOSIS — M5431 Sciatica, right side: Secondary | ICD-10-CM | POA: Diagnosis not present

## 2023-07-27 DIAGNOSIS — F32A Depression, unspecified: Secondary | ICD-10-CM | POA: Insufficient documentation

## 2023-07-27 DIAGNOSIS — Z5900 Homelessness unspecified: Secondary | ICD-10-CM

## 2023-07-27 DIAGNOSIS — F1494 Cocaine use, unspecified with cocaine-induced mood disorder: Secondary | ICD-10-CM | POA: Diagnosis present

## 2023-07-27 LAB — CBC WITH DIFFERENTIAL/PLATELET
Abs Immature Granulocytes: 0.01 10*3/uL (ref 0.00–0.07)
Basophils Absolute: 0 10*3/uL (ref 0.0–0.1)
Basophils Relative: 0 %
Eosinophils Absolute: 0.1 10*3/uL (ref 0.0–0.5)
Eosinophils Relative: 1 %
HCT: 43 % (ref 39.0–52.0)
Hemoglobin: 14.5 g/dL (ref 13.0–17.0)
Immature Granulocytes: 0 %
Lymphocytes Relative: 12 %
Lymphs Abs: 0.6 10*3/uL — ABNORMAL LOW (ref 0.7–4.0)
MCH: 28 pg (ref 26.0–34.0)
MCHC: 33.7 g/dL (ref 30.0–36.0)
MCV: 83.2 fL (ref 80.0–100.0)
Monocytes Absolute: 0.3 10*3/uL (ref 0.1–1.0)
Monocytes Relative: 6 %
Neutro Abs: 4 10*3/uL (ref 1.7–7.7)
Neutrophils Relative %: 81 %
Platelets: 191 10*3/uL (ref 150–400)
RBC: 5.17 MIL/uL (ref 4.22–5.81)
RDW: 14.3 % (ref 11.5–15.5)
WBC: 5 10*3/uL (ref 4.0–10.5)
nRBC: 0 % (ref 0.0–0.2)

## 2023-07-27 LAB — COMPREHENSIVE METABOLIC PANEL
ALT: 25 U/L (ref 0–44)
AST: 39 U/L (ref 15–41)
Albumin: 3.4 g/dL — ABNORMAL LOW (ref 3.5–5.0)
Alkaline Phosphatase: 50 U/L (ref 38–126)
Anion gap: 10 (ref 5–15)
BUN: 14 mg/dL (ref 6–20)
CO2: 22 mmol/L (ref 22–32)
Calcium: 8.7 mg/dL — ABNORMAL LOW (ref 8.9–10.3)
Chloride: 104 mmol/L (ref 98–111)
Creatinine, Ser: 1.01 mg/dL (ref 0.61–1.24)
GFR, Estimated: >60 mL/min (ref >60)
Glucose, Bld: 170 mg/dL — ABNORMAL HIGH (ref 70–99)
Potassium: 3.7 mmol/L (ref 3.5–5.1)
Sodium: 136 mmol/L (ref 135–145)
Total Bilirubin: 0.8 mg/dL (ref 0.0–1.2)
Total Protein: 6.3 g/dL — ABNORMAL LOW (ref 6.5–8.1)

## 2023-07-27 LAB — CBG MONITORING, ED: Glucose-Capillary: 157 mg/dL — ABNORMAL HIGH (ref 70–99)

## 2023-07-27 LAB — ETHANOL: Alcohol, Ethyl (B): <10 mg/dL (ref <10)

## 2023-07-27 LAB — ACETAMINOPHEN LEVEL: Acetaminophen (Tylenol), Serum: 10 ug/mL (ref 10–30)

## 2023-07-27 LAB — SALICYLATE LEVEL: Salicylate Lvl: <7 mg/dL — ABNORMAL LOW (ref 7.0–30.0)

## 2023-07-27 MED ORDER — PREDNISONE 10 MG (21) PO TBPK: ORAL_TABLET | Freq: Every day | ORAL | 0 refills | Status: DC

## 2023-07-27 MED ORDER — OXYCODONE-ACETAMINOPHEN 5-325 MG PO TABS
1.0000 | ORAL_TABLET | Freq: Four times a day (QID) | ORAL | Status: DC | PRN
  Administered 2023-07-27: 1 via ORAL
  Filled 2023-07-27: qty 1

## 2023-07-27 MED ORDER — ACETAMINOPHEN 500 MG PO TABS
1000.0000 mg | ORAL_TABLET | Freq: Once | ORAL | Status: AC
  Administered 2023-07-27: 1000 mg via ORAL
  Filled 2023-07-27: qty 2

## 2023-07-27 MED ORDER — MORPHINE SULFATE (PF) 4 MG/ML IV SOLN
4.0000 mg | Freq: Once | INTRAVENOUS | Status: AC
  Administered 2023-07-27: 4 mg via INTRAMUSCULAR
  Filled 2023-07-27: qty 1

## 2023-07-27 MED ORDER — PREDNISONE 20 MG PO TABS
50.0000 mg | ORAL_TABLET | Freq: Once | ORAL | Status: AC
  Administered 2023-07-28: 50 mg via ORAL
  Filled 2023-07-27: qty 3

## 2023-07-27 MED ORDER — OXYCODONE-ACETAMINOPHEN 5-325 MG PO TABS: 1.0000 | ORAL_TABLET | Freq: Four times a day (QID) | ORAL | 0 refills | Status: DC | PRN

## 2023-07-27 MED ORDER — PREDNISONE 20 MG PO TABS
60.0000 mg | ORAL_TABLET | Freq: Once | ORAL | Status: AC
  Administered 2023-07-27: 60 mg via ORAL
  Filled 2023-07-27: qty 3

## 2023-07-27 MED ORDER — KETOROLAC TROMETHAMINE 30 MG/ML IJ SOLN
30.0000 mg | Freq: Once | INTRAMUSCULAR | Status: AC
  Administered 2023-07-27: 30 mg via INTRAMUSCULAR
  Filled 2023-07-27: qty 1

## 2023-07-27 MED ORDER — LIDOCAINE 5 % EX PTCH: 1.0000 | MEDICATED_PATCH | CUTANEOUS | 0 refills | Status: DC

## 2023-07-27 NOTE — Progress Notes (Signed)
VASCULAR LAB    Right lower extremity venous duplex has been performed.  See CV proc for preliminary results.  Messaged results to [employer], PA-C  Leander Tout, RVT 07/27/2023, 4:28 PM

## 2023-07-27 NOTE — Discharge Instructions (Signed)
Stop taking your Mobic or any other anti-inflammatories such as ibuprofen or Aleve while taking the prednisone  Please also take Tylenol as needed for your pain along with lidocaine patches we have prescribed you.  Make sure to follow-up outpatient with your spine specialist, return for new or worsening symptoms.  Follow-up with behavioral health urgent care for your substance use concerns and mental health concerns.   Outpatient psychiatric Services  Walk in hours for medication management Monday, Wednesday, Thursday, and Friday from 8:00 AM to 11:00 AM Recommend arriving by by 7:30 AM.  It is first come first serve.    Walk in hours for therapy intake Monday and Wednesday only 8:00 AM to 11:00 AM Encouraged to arrive by 7:30 AM.  It is first come first serve   Inpatient patient psychiatric services The Facility Based Crisis Unit offers comprehensive behavioral heath care services for mental health and substance abuse treatment.  Social work can also assist with referral to or getting you into a rehabilitation program short or long term  [facility] Monday - Friday: Services: 8:00AM - 3:00PM Offices: 8:00AM - 5:00PM  Physical Address [address] [place], [place] [address]   Please use this address for [facility] Mailing Address [address] [address] [place], [place] [address]  The [facility] helps people reconnect This is a safe place to rest, take care of basic needs and access the services and community that make all the difference. Our guests come to the [facility] to take a class, do laundry, meet with a case manager or to get their mail. Sometimes they just need to sit in our dayroom and enjoy a conversation.  Here you will find everything from shower facilities to a computer lab, a mail room, classrooms and meeting spaces.  The [facility] helps people reconnect with their own lives and with the community at large.  A caring community setting One of the most exciting aspects of  the [facility] is that so many individuals and organizations in the community are a part of the everyday experience. Whether it's a hair stylist or law firm offering services right in-house, our partners make the [facility] a truly interactive resource center where services are brought to our guests. The [facility] brings together a comprehensive community of talented people who not only want to help solve problems, but also to be a part of our guests' lives.  Integrated Care We take a person-centered approach to assistance that includes: Case management Geneticist, molecular Medical clinic Mental health nurse Referrals  Fundamental Services We start with necessities: Midwife and Armed forces operational officer addresses and mailboxes Replacement IDs Onsite barbershop Storage lockers [place] winter warming center  Self-Sufficiency We connect our guests with: Skilled trade classes Job skills classes Resume and jobs application assistance Interview training GED Counsellor [facility]) Monday - Friday 8am - 3pm          Sat & Sun 8am - 2pm [address] [place], [place] [address]   [phone]     www.interactiveresourcecenter.org IRC offers among other critical resources: showers, laundry, barbershop, phone bank, mailroom, computer lab, medical clinic, gardens and a bike maintenance area.   AREA SHELTERS  [place] [facility] [employer]  (Men & women) [address] [place] [phone]  [facility] (Men/women/families) [address] [place] [phone]   [facility] (Families with children) [phone] [address]  [place] [phone]  Clara Dillard's Engineer, manufacturing systems) [address]  [place] [phone]   [facility] (Children ages 31-17) [address]  [place] [phone]   [facility]   (Women &  children) [address] [place] [phone]   [facility] (Women/substance abuse) [address]  [place] [phone]   [facility] (Men) [address]  [place] [phone]   [facility] (Men) [address]  [place] [phone]  [employer] (Women) [phone]  [place] [phone]   [facility] (Single women & women with children) [address]  [place] [phone]  [facility] (Men/women/families) [address] [phone]    [facility]   (Domestic Violence shelter) [address]  [place] [phone]   [facility] (Men & women) [address]  [place] [phone]  Angelina P[facility] Min (Men) [address]  [employer] [phone]   [facility] (Men) [address]  [place] [phone]   Holiday representative (Single women & families) [address]  [place] [phone]  [facility]. (Men/women & families) [address]  [place] [phone]    If you are at risk of losing your housing (throughout [facility]) call the Housing Hotline at 779 343 4453. You may also contact 2-1-1, a FREE service of the [address] that provides information about many resources including housing. Dial 211, or visit online at PooledIncome.pl. [facility]:  [facility] [place], contact Janelle Floor [phone] (men only)                          Huey Romans in [place]:  90 day homeless program for women and men;                             contact Rev. Chambers [phone]  [facility]:  [facility]:  men/women/children [phone]  [facility]):  Shelter in [place], [place] [phone]                       [facility] in [place], [place] [phone]   [facility]:  [facility] for Abused Women, [phone]; (women and children)  [facility]:   [employer], men/women/children; [phone]                           [facility] in [place], [phone]; substance abuse halfway house for men              [facility]; 4 bedroom house in [facility] for homeless women, contact Jenne Campus [phone]               [facility] in [place], [phone] (women and children)               Friend to [facility], for abused women and children, 24 hour crisis line, (269)333-8182, Jamison Oka  [facility], halfway house for women, [place], [phone]  [facility]:  [facility], [phone]; open Mon-Thurs from HYI50 - March 15 when temp is below 32 degrees                              [facility]; [place], [phone]; cell [phone]; open 24/7  [place]:  [facility] - Rev Ladona Ridgel - [phone]  [place]:  transitional housing for women and children; Arminda Resides [phone] Shelter List:  [facility]) [address] [place], [place]  Phone: [phone]  [facility] [address] [place], [place] [address] Phone: [phone]  [facility] (Women only) [address] [place], [place] [address] Phone: [phone]  [facility] [address] [place], [place] [address] Phone: [phone]  [facility]: [phone]. [address] [place], [place] [address] Phone: [phone]  [facility]flow Shelter  [address] [place], [place] [address] (Check in at 6:00PM for placement at a local shelter) Phone: [phone]

## 2023-07-27 NOTE — ED Provider Triage Note (Signed)
Emergency Medicine Provider Triage Evaluation Note  Brian Daugherty , a 58 y.o. male  was evaluated in triage.  Pt complains of sciatic nerve pain.    Review of Systems  Positive: Pain down leg Negative: fever  Physical Exam  BP 121/76 (BP Location: Right Arm)   Pulse 77   Temp 98.9 F (37.2 C) (Oral)   Resp 16   SpO2 100%  Gen:   Sleepy, awakes to voice  Resp:  Normal effort  MSK:   Moves extremities without difficulty  Other:    Medical Decision Making  Medically screening exam initiated at 9:09 AM.  Appropriate orders placed.  Brian Daugherty was informed that the remainder of the evaluation will be completed by another provider, this initial triage assessment does not replace that evaluation, and the importance of remaining in the ED until their evaluation is complete.  Pt denies any sedating medications,    Brian Daugherty, [place] 07/27/23 1610

## 2023-07-27 NOTE — ED Provider Notes (Cosign Needed Addendum)
[place] EMERGENCY DEPARTMENT AT [facility] Provider Note   CSN: [id number] Arrival date & time: 07/27/23  4098    History  Right leg pain   Brian Daugherty is a 58 y.o. male history of chronic lower back pain with right extremity radiculopathy followed by [facility] here for evaluation of right leg pain.  Patient states he woke up this morning and had more than normal right leg pain.  States he recently had an MRI which showed impingement on his nerves on the right.  He denies any recent falls or injuries.  No numbness, weakness.  No bowel or bladder incontinence, saddle paresthesia, history of IVDU, fever. No abd pain, hx of known AAA, dissection.  States has been taking the medications as directed and still having pain.  He states he has been out of work for quite some time due to the pain.  PE or DVT.  No recent surgery, immobilization or malignancy.  No recent illnesses.   Patient notes that he is suicidal.  He has increasing depression due to decrease in mobility and chronic pain to his back.  He is afraid if he goes home he will overdose on medications.  No history of similar he does have a history of cocaine abuse which she states he has no longer been using.  No HI, AVH.  HPI     Home Medications Prior to Admission medications   Medication Sig Start Date End Date Taking? Authorizing Provider  lidocaine (LIDODERM) 5 % Place 1 patch onto the skin daily. Remove & Discard patch within 12 hours or as directed by MD 07/27/23  Yes Ayde Record A, PA-C  oxyCODONE-acetaminophen (PERCOCET/ROXICET) 5-325 MG tablet Take 1 tablet by mouth every 6 (six) hours as needed for severe pain (pain score 7-10). 07/27/23  Yes Kiernan Farkas A, PA-C  predniSONE (STERAPRED UNI-PAK 21 TAB) 10 MG (21) TBPK tablet Take by mouth daily. Take 6 tabs by mouth daily  for 1 days, then 5 tabs for 1 days, then 4 tabs for 1 days, then 3 tabs for 1 days, 2 tabs for 1 days, then 1 tab by mouth daily for 1  days 07/27/23  Yes [place]ceola Depaz A, PA-C  amLODipine (NORVASC) 5 MG tablet Take 1 tablet (5 mg total) by mouth daily. 12/19/22 12/19/23  Sarita Bottom, MD  FLUoxetine (PROZAC) 20 MG capsule Take 1 capsule (20 mg total) by mouth daily. 12/20/22   Sarita Bottom, MD  traZODone (DESYREL) 50 MG tablet Take 1 tablet (50 mg total) by mouth at bedtime as needed for sleep. 12/19/22   Sarita Bottom, MD      Allergies    Patient has no known allergies.    Review of Systems   Review of Systems  Constitutional: Negative.   HENT: Negative.    Respiratory: Negative.    Cardiovascular: Negative.   Gastrointestinal: Negative.   Genitourinary: Negative.   Musculoskeletal:  Positive for back pain.       Back pain, RLE pain  Skin: Negative.   Neurological: Negative.   All other systems reviewed and are negative.   Physical Exam Updated Vital Signs BP 116/73 (BP Location: Right Arm)   Pulse 78   Temp 98.2 F (36.8 C) (Oral)   Resp 17   SpO2 99%  Physical Exam Vitals and nursing note reviewed.  Constitutional:      General: He is not in acute distress.    Appearance: He is well-developed. He is not ill-appearing, toxic-appearing or  diaphoretic.  HENT:     Head: Atraumatic.  Eyes:     Pupils: Pupils are equal, round, and reactive to light.  Cardiovascular:     Rate and Rhythm: Normal rate and regular rhythm.     Pulses: Normal pulses.          Dorsalis pedis pulses are 2+ on the right side and 2+ on the left side.     Heart sounds: Normal heart sounds.  Pulmonary:     Effort: Pulmonary effort is normal. No respiratory distress.     Breath sounds: Normal breath sounds.  Abdominal:     General: Bowel sounds are normal. There is no distension.     Palpations: Abdomen is soft.     Tenderness: There is no abdominal tenderness. There is no right CVA tenderness, left CVA tenderness or guarding.  Genitourinary:    Rectum: Guaiac result negative.  Musculoskeletal:        General: Tenderness  present. No swelling, deformity or signs of injury. Normal range of motion.     Cervical back: Normal range of motion and neck supple.     Right lower leg: No edema.     Left lower leg: No edema.     Comments: Tenderness right right lower back into RLE. Compartments soft. No LE swelling. Positive straight leg on right at 40'  Skin:    General: Skin is warm and dry.     Capillary Refill: Capillary refill takes less than 2 seconds.     Comments: No edema, erythema or warmth.  No obvious rashes or lesions on exposed skin  Neurological:     General: No focal deficit present.     Mental Status: He is alert and oriented to person, place, and time.     Cranial Nerves: No cranial nerve deficit.     Motor: No weakness.     Comments: Refuses to walk. States too much pain  Psychiatric:        Attention and Perception: Attention and perception normal.        Mood and Affect: Mood is depressed. Affect is tearful.        Speech: Speech normal.        Behavior: Behavior is withdrawn. Behavior is cooperative.        Thought Content: Thought content is not paranoid or delusional. Thought content includes suicidal ideation. Thought content does not include homicidal ideation. Thought content includes suicidal plan. Thought content does not include homicidal plan.    ED Results / Procedures / Treatments   Labs (all labs ordered are listed, but only abnormal results are displayed) Labs Reviewed  CBC WITH DIFFERENTIAL/PLATELET - Abnormal; Notable for the following components:      Result Value   Lymphs Abs 0.6 (*)    All other components within normal limits  COMPREHENSIVE METABOLIC PANEL - Abnormal; Notable for the following components:   Glucose, Bld 170 (*)    Calcium 8.7 (*)    Total Protein 6.3 (*)    Albumin 3.4 (*)    All other components within normal limits  SALICYLATE LEVEL - Abnormal; Notable for the following components:   Salicylate Lvl <7.0 (*)    All other components within normal  limits  CBG MONITORING, ED - Abnormal; Notable for the following components:   Glucose-Capillary 157 (*)    All other components within normal limits  ACETAMINOPHEN LEVEL  ETHANOL  RAPID URINE DRUG SCREEN, HOSP PERFORMED    EKG None  Radiology VAS [place] LOWER EXTREMITY VENOUS (DVT) (ONLY [facility] & [facility]) Result Date: 07/27/2023  Lower Venous DVT Study Patient Name:  BERTON BUTRICK  Date of Exam:   07/27/2023 Medical Rec #: [id number]      Accession #:    [id number] Date of Birth: 05/01/1966      Patient Gender: M Patient Age:   85 years Exam Location:  [facility] Procedure:      VAS [place] LOWER EXTREMITY VENOUS (DVT) Referring Phys: Tajon Moring --------------------------------------------------------------------------------  Indications: Lateral hip pain that radiates down lateral thigh.  Risk Factors: Patient has known lumbar radiculopathy. Limitations: Patient's pain with positioning. Comparison Study: No prior study on file Performing Technologist: Sherren Kerns RVS  Examination Guidelines: A complete evaluation includes B-mode imaging, spectral Doppler, color Doppler, and power Doppler as needed of all accessible portions of each vessel. Bilateral testing is considered an integral part of a complete examination. Limited examinations for reoccurring indications may be performed as noted. The reflux portion of the exam is performed with the patient in reverse Trendelenburg.  +---------+---------------+---------+-----------+---------------+--------------+ RIGHT    CompressibilityPhasicitySpontaneityProperties     Thrombus Aging +---------+---------------+---------+-----------+---------------+--------------+ CFV      Full           Yes      No         pulsatile                                                                 waveforms                     +---------+---------------+---------+-----------+---------------+--------------+ SFJ      Full                                                              +---------+---------------+---------+-----------+---------------+--------------+ FV Prox  Full                                                             +---------+---------------+---------+-----------+---------------+--------------+ FV Mid   Full                                                             +---------+---------------+---------+-----------+---------------+--------------+ FV DistalFull           Yes      No         pulsatile  waveforms                     +---------+---------------+---------+-----------+---------------+--------------+ PFV      Full                                                             +---------+---------------+---------+-----------+---------------+--------------+ POP      Full           Yes      No         pulsatile                                                                 waveforms                     +---------+---------------+---------+-----------+---------------+--------------+ PTV      Full                                                             +---------+---------------+---------+-----------+---------------+--------------+ PERO                                                       Not well                                                                  visualized     +---------+---------------+---------+-----------+---------------+--------------+   +----+---------------+---------+-----------+-------------------+--------------+ LEFTCompressibilityPhasicitySpontaneityProperties         Thrombus Aging +----+---------------+---------+-----------+-------------------+--------------+ CFV Full           Yes      No         pulsatile waveforms               +----+---------------+---------+-----------+-------------------+--------------+    Summary: RIGHT: - There is no evidence of deep vein  thrombosis in the lower extremity. However, portions of this examination were limited- see technologist comments above.  - No cystic structure found in the popliteal fossa. pulsatile waveforms  LEFT: - No evidence of common femoral vein obstruction.  Pulsatile waveforms.  *See table(s) above for measurements and observations.    Preliminary     Procedures Procedures    Medications Ordered in ED Medications  oxyCODONE-acetaminophen (PERCOCET/ROXICET) 5-325 MG per tablet 1 tablet (has no administration in time range)  predniSONE (DELTASONE) tablet 50 mg (has no administration in time range)  ketorolac (TORADOL) 30 MG/ML injection 30 mg (30 mg Intramuscular Given 07/27/23 1544)  morphine (PF) 4 MG/ML injection 4 mg (4 mg Intramuscular Given 07/27/23 1545)  predniSONE (DELTASONE) tablet 60 mg (60 mg Oral Given 07/27/23 1543)  acetaminophen (TYLENOL) tablet 1,000 mg (1,000 mg Oral Given 07/27/23 1543)    ED Course/ Medical Decision Making/ A&P   58 year old here for evaluation of right leg pain.  He has known chronic back pain followed by [facility].  Pain today in his right leg worse than normal.  He denies any redness warmth.  No history of PE or DVT.  He is neurovascularly intact.  No midline pulsatile abdominal mass, no history of AAA or dissection.  He says he still some back pain however this is chronic and unchanged.  No fever, history of IVDU.  He states he had an MRI about a month ago which showed nerve impingement on the right.  Will treat his pain.  Will add on ultrasound just to rule out any additional pathology he denies any recent injury or trauma do not feel any additional x-rays.  He has no obvious infectious process on exam.  Do not think he needs additional lower back imaging at this time as well.  Will plan on follow-up after ultrasound results when he gets pain management  Imaging personally viewed interpreted Ultrasound negative for VTE  Patient reassessed.  Pain controlled,  ambulatory, continues to be neurovascularly intact.  I suspect his pain is likely acute on chronic.  At this time low suspicion for acute AAA, dissection, VTE, ischemia, cauda equina, discitis, osteomyelitis, transverse myelitis, psoas abscess.  Will have him follow back up with a spine specialist.  Wrote for short course of steroids as well as pain management.  I discussed with him not taking additional NSAIDs while taking this, GI bleed risk discussed.  The patient has been appropriately medically screened and/or stabilized in the ED. I have low suspicion for any other emergent medical condition which would require further screening, evaluation or treatment in the ED or require inpatient management.  Patient is hemodynamically stable and in no acute distress.  Patient able to ambulate in department prior to ED.  Evaluation does not show acute pathology that would require ongoing or additional emergent interventions while in the emergency department or further inpatient treatment.  I have discussed the diagnosis with the patient and answered all questions.  Pain is been managed while in the emergency department and patient has no further complaints prior to discharge.  Patient is comfortable with plan discussed in room and is stable for discharge at this time.  I have discussed strict return precautions for returning to the emergency department.  Patient was encouraged to follow-up with PCP/specialist refer to at discharge.    ADDEND: I initially went to discharge patient.  He stated he also checked in for suicide ideation.  He did not mention that to me when he first checked in.  He is very tearful.  He states he can no longer live this way he cannot work due to his pain and he feels like his depression is worsening.  All he wants to do is sleep.  He is afraid if he goes home he will overdose on medications.  He denies any HI, AVH.  Will plan on psychiatric clearance labs.   Patient medically cleared.   Disposition per TTS.  Of note I had previous sent in back pain meds to [facility] on [address]. Attempted to call and cancel narcotic rx given now stating SI however pharmacy closed. No voicemail.  Medical Decision Making Amount and/or Complexity of Data Reviewed External Data Reviewed: labs, radiology and notes. Labs: ordered. Radiology: ordered and independent interpretation performed. Decision-making details documented in ED Course.  Risk OTC drugs. Prescription drug management. Parenteral controlled substances. Decision regarding hospitalization. Diagnosis or treatment significantly limited by social determinants of health.    Final Clinical Impression(s) / ED Diagnoses Final diagnoses:  Sciatica of right side  Suicidal ideation    Rx / DC Orders ED Discharge Orders          Ordered    predniSONE (STERAPRED UNI-PAK 21 TAB) 10 MG (21) TBPK tablet  Daily        07/27/23 1733    oxyCODONE-acetaminophen (PERCOCET/ROXICET) 5-325 MG tablet  Every 6 hours PRN        07/27/23 1733    lidocaine (LIDODERM) 5 %  Every 24 hours        07/27/23 1733              Raider Valbuena A, PA-C 07/27/23 1912    Jaki Hammerschmidt A, PA-C 07/27/23 1913    Lonell Grandchild, MD 07/29/23 1008

## 2023-07-27 NOTE — ED Triage Notes (Signed)
Patient complains of right leg pain for months-no new injury and reports due to chronic back pain. Alert and oriented, NAD

## 2023-07-27 NOTE — ED Notes (Signed)
Pt belongings put in locker 4

## 2023-07-27 NOTE — ED Notes (Signed)
Patient informed that urine is needed for UDS, states not able now but will provide it when able.

## 2023-07-28 ENCOUNTER — Encounter: Payer: Self-pay | Admitting: Nurse Practitioner

## 2023-07-28 ENCOUNTER — Encounter (HOSPITAL_COMMUNITY): Payer: Self-pay | Admitting: Psychiatric/Mental Health

## 2023-07-28 ENCOUNTER — Other Ambulatory Visit: Payer: Self-pay

## 2023-07-28 ENCOUNTER — Inpatient Hospital Stay
Admission: AD | Admit: 2023-07-28 | Discharge: 2023-08-05 | DRG: 885 | Payer: BLUE CROSS/BLUE SHIELD | Source: Intra-hospital | Attending: Psychiatry | Admitting: Psychiatry

## 2023-07-28 DIAGNOSIS — F1494 Cocaine use, unspecified with cocaine-induced mood disorder: Principal | ICD-10-CM | POA: Diagnosis present

## 2023-07-28 DIAGNOSIS — G4700 Insomnia, unspecified: Secondary | ICD-10-CM | POA: Diagnosis present

## 2023-07-28 DIAGNOSIS — F322 Major depressive disorder, single episode, severe without psychotic features: Principal | ICD-10-CM | POA: Diagnosis present

## 2023-07-28 DIAGNOSIS — Z79899 Other long term (current) drug therapy: Secondary | ICD-10-CM | POA: Diagnosis not present

## 2023-07-28 DIAGNOSIS — F32A Depression, unspecified: Secondary | ICD-10-CM | POA: Diagnosis not present

## 2023-07-28 DIAGNOSIS — Z8679 Personal history of other diseases of the circulatory system: Secondary | ICD-10-CM

## 2023-07-28 DIAGNOSIS — F1420 Cocaine dependence, uncomplicated: Secondary | ICD-10-CM | POA: Diagnosis not present

## 2023-07-28 DIAGNOSIS — I10 Essential (primary) hypertension: Secondary | ICD-10-CM | POA: Diagnosis present

## 2023-07-28 DIAGNOSIS — M2550 Pain in unspecified joint: Secondary | ICD-10-CM | POA: Diagnosis present

## 2023-07-28 DIAGNOSIS — M5416 Radiculopathy, lumbar region: Secondary | ICD-10-CM | POA: Diagnosis present

## 2023-07-28 DIAGNOSIS — Z5941 Food insecurity: Secondary | ICD-10-CM | POA: Diagnosis not present

## 2023-07-28 DIAGNOSIS — Z7409 Other reduced mobility: Secondary | ICD-10-CM | POA: Diagnosis present

## 2023-07-28 DIAGNOSIS — M545 Low back pain, unspecified: Secondary | ICD-10-CM | POA: Diagnosis present

## 2023-07-28 DIAGNOSIS — F17210 Nicotine dependence, cigarettes, uncomplicated: Secondary | ICD-10-CM | POA: Diagnosis present

## 2023-07-28 DIAGNOSIS — G894 Chronic pain syndrome: Secondary | ICD-10-CM | POA: Diagnosis present

## 2023-07-28 DIAGNOSIS — Z634 Disappearance and death of family member: Secondary | ICD-10-CM

## 2023-07-28 DIAGNOSIS — R45851 Suicidal ideations: Secondary | ICD-10-CM | POA: Diagnosis present

## 2023-07-28 DIAGNOSIS — Z5948 Other specified lack of adequate food: Secondary | ICD-10-CM | POA: Diagnosis not present

## 2023-07-28 DIAGNOSIS — Z716 Tobacco abuse counseling: Secondary | ICD-10-CM | POA: Diagnosis not present

## 2023-07-28 DIAGNOSIS — Z9151 Personal history of suicidal behavior: Secondary | ICD-10-CM | POA: Diagnosis not present

## 2023-07-28 DIAGNOSIS — F419 Anxiety disorder, unspecified: Secondary | ICD-10-CM | POA: Diagnosis present

## 2023-07-28 DIAGNOSIS — H409 Unspecified glaucoma: Secondary | ICD-10-CM | POA: Diagnosis present

## 2023-07-28 DIAGNOSIS — Z5982 Transportation insecurity: Secondary | ICD-10-CM

## 2023-07-28 DIAGNOSIS — E785 Hyperlipidemia, unspecified: Secondary | ICD-10-CM | POA: Diagnosis present

## 2023-07-28 DIAGNOSIS — Z5900 Homelessness unspecified: Secondary | ICD-10-CM | POA: Diagnosis not present

## 2023-07-28 DIAGNOSIS — F1414 Cocaine abuse with cocaine-induced mood disorder: Secondary | ICD-10-CM | POA: Diagnosis present

## 2023-07-28 DIAGNOSIS — M79604 Pain in right leg: Secondary | ICD-10-CM | POA: Diagnosis present

## 2023-07-28 LAB — RAPID URINE DRUG SCREEN, HOSP PERFORMED
Amphetamines: NOT DETECTED
Barbiturates: NOT DETECTED
Benzodiazepines: NOT DETECTED
Cocaine: POSITIVE — AB
Opiates: POSITIVE — AB
Tetrahydrocannabinol: POSITIVE — AB

## 2023-07-28 MED ORDER — OXYCODONE-ACETAMINOPHEN 5-325 MG PO TABS: 1.0000 | ORAL_TABLET | Freq: Two times a day (BID) | ORAL | Status: DC | PRN

## 2023-07-28 MED ORDER — DIPHENHYDRAMINE HCL 50 MG/ML IJ SOLN: 50.0000 mg | Freq: Three times a day (TID) | INTRAMUSCULAR | Status: DC | PRN

## 2023-07-28 MED ORDER — FLUOXETINE HCL 20 MG PO CAPS
20.0000 mg | ORAL_CAPSULE | Freq: Every day | ORAL | Status: DC
  Administered 2023-07-29: 20 mg via ORAL
  Filled 2023-07-28: qty 1

## 2023-07-28 MED ORDER — LORAZEPAM 2 MG/ML IJ SOLN: 2.0000 mg | Freq: Three times a day (TID) | INTRAMUSCULAR | Status: DC | PRN

## 2023-07-28 MED ORDER — DIPHENHYDRAMINE HCL 25 MG PO CAPS: 50.0000 mg | ORAL_CAPSULE | Freq: Three times a day (TID) | ORAL | Status: DC | PRN

## 2023-07-28 MED ORDER — HYDROXYZINE HCL 25 MG PO TABS: 25.0000 mg | ORAL_TABLET | Freq: Three times a day (TID) | ORAL | Status: DC | PRN

## 2023-07-28 MED ORDER — ACETAMINOPHEN 325 MG PO TABS: 650.0000 mg | ORAL_TABLET | Freq: Four times a day (QID) | ORAL | Status: DC | PRN

## 2023-07-28 MED ORDER — OLANZAPINE 10 MG IM SOLR: 10.0000 mg | Freq: Once | INTRAMUSCULAR | Status: DC | PRN

## 2023-07-28 MED ORDER — ALUM & MAG HYDROXIDE-SIMETH 200-200-20 MG/5ML PO SUSP: 30.0000 mL | ORAL | Status: DC | PRN

## 2023-07-28 MED ORDER — MAGNESIUM HYDROXIDE 400 MG/5ML PO SUSP: 30.0000 mL | Freq: Every day | ORAL | Status: DC | PRN

## 2023-07-28 MED ORDER — HALOPERIDOL LACTATE 5 MG/ML IJ SOLN: 10.0000 mg | Freq: Three times a day (TID) | INTRAMUSCULAR | Status: DC | PRN

## 2023-07-28 MED ORDER — GABAPENTIN 300 MG PO CAPS
600.0000 mg | ORAL_CAPSULE | Freq: Every day | ORAL | Status: DC
  Administered 2023-07-29 – 2023-08-04 (×7): 600 mg via ORAL
  Filled 2023-07-28 (×7): qty 2

## 2023-07-28 MED ORDER — AMLODIPINE BESYLATE 5 MG PO TABS
5.0000 mg | ORAL_TABLET | Freq: Every day | ORAL | Status: DC
Start: 2023-07-29 — End: 2023-08-05
  Administered 2023-07-29 – 2023-08-05 (×8): 5 mg via ORAL
  Filled 2023-07-28 (×8): qty 1

## 2023-07-28 MED ORDER — OXYCODONE-ACETAMINOPHEN 5-325 MG PO TABS: 1.0000 | ORAL_TABLET | Freq: Four times a day (QID) | ORAL | Status: DC | PRN

## 2023-07-28 MED ORDER — TRAZODONE HCL 50 MG PO TABS
50.0000 mg | ORAL_TABLET | Freq: Every evening | ORAL | Status: DC | PRN
  Administered 2023-07-29 – 2023-08-03 (×5): 50 mg via ORAL
  Filled 2023-07-28 (×5): qty 1

## 2023-07-28 MED ORDER — HYDROXYZINE HCL 25 MG PO TABS
25.0000 mg | ORAL_TABLET | Freq: Three times a day (TID) | ORAL | Status: DC | PRN
  Administered 2023-07-30 – 2023-08-03 (×2): 25 mg via ORAL
  Filled 2023-07-28 (×2): qty 1

## 2023-07-28 MED ORDER — HALOPERIDOL LACTATE 5 MG/ML IJ SOLN: 5.0000 mg | Freq: Three times a day (TID) | INTRAMUSCULAR | Status: DC | PRN

## 2023-07-28 MED ORDER — ENSURE ENLIVE PO LIQD
237.0000 mL | Freq: Two times a day (BID) | ORAL | Status: DC
  Administered 2023-07-29 – 2023-08-04 (×6): 237 mL via ORAL

## 2023-07-28 MED ORDER — HALOPERIDOL 5 MG PO TABS: 5.0000 mg | ORAL_TABLET | Freq: Three times a day (TID) | ORAL | Status: DC | PRN

## 2023-07-28 MED ORDER — NICOTINE 14 MG/24HR TD PT24
14.0000 mg | MEDICATED_PATCH | Freq: Every day | TRANSDERMAL | Status: DC
  Filled 2023-07-28 (×7): qty 1

## 2023-07-28 MED ORDER — GABAPENTIN 300 MG PO CAPS
300.0000 mg | ORAL_CAPSULE | ORAL | Status: AC
  Administered 2023-07-28: 300 mg via ORAL
  Filled 2023-07-28: qty 1

## 2023-07-28 NOTE — Consult Note (Cosign Needed)
[facility] Consult Note  Patient Name: Brian Daugherty MRN: [id number] DOB: 1965-12-27 DATE OF Consult: 07/28/2023  PRIMARY PSYCHIATRIC DIAGNOSES  1.  Depressive Disorder, Unspecified 2.  Cocaine Use Disorder, Moderate 3.  Homelessness 4. Suicidal Ideations   RECOMMENDATIONS  Recommendations: Medication recommendations:  -- Patient is not interested in scheduled psychotropic medications -- Zyprexa 10mg  PO/IM Q6H PRN for acute agitation -- Hydroxyzine 25mg  po TID PRN for anxiety   Non-Medication/therapeutic recommendations:  -- Case management consultation to assist with shelter placement/ resources -- Patient interested in speaking with SW regarding [facility] programming and/or other substance abuse treatment resources.   Is inpatient psychiatric hospitalization recommended for this patient?  -- Patient is not interested or willing for inpatient psychiatric treatment. His suicidality appears conditional based on housing and current homelessness. Interested in substance abuse treatment. He does not meet criteria for IVC at this time.   Communication: Treatment team members (and family members if applicable) who were involved in treatment/care discussions and planning, and with whom we spoke or engaged with via secure text/chat, include the following: ED primary team   Thank you for involving [place] in the care of this patient. If you have any additional questions or concerns, please call [phone] and ask for me or the provider on-call.  TELEPSYCHIATRY ATTESTATION & CONSENT  As the provider for this telehealth consult, I attest that I verified the patient's identity using two separate identifiers, introduced myself to the patient, provided my credentials, disclosed my location, and performed this encounter via a HIPAA-compliant, real-time, face-to-face, two-way, interactive audio and video platform and with the full consent and agreement of the patient (or guardian as applicable.)   Patient physical location: ED in [facility]. Telehealth provider physical location: home office in state of [place].  Video start time: 0040 (Central Time) Video end time: 0103 (Central Time)  IDENTIFYING DATA  Brian Daugherty is a 58 y.o. year-old male for whom a psychiatric consultation has been ordered by the primary provider. The patient was identified using two separate identifiers.  CHIEF COMPLAINT/REASON FOR CONSULT  Suicidal Ideations   HISTORY OF PRESENT ILLNESS (HPI)  The patient is a 58yo male who presented to the emergency department with initial concerns for right leg pain. Report chronic pain issues. Patient also endorsed suicidal ideations, increasing depression due to decreased mobility and chronic back pain. He reported feeling afraid to discharge with possibility of overdose.   Upon interview, patient is calm and cooperative. He shares "it just seems like moving forward in life, it starts real good and then one thing sets me back so far". Patient shares recent stressors including: living arrangement- homelessness, chronic pain, his addiction to cocaine, recent loss of mother last year. Patient endorses depressive moods, mostly due to his housing arrangement. He does not have any shelter placement, lives on the streets. States he does not like this and would not wish it upon anyone. Patient also mentions history of cocaine dependence. He states his current use is "not that bad"; smokes crack/cocaine weekly and last used 2 days ago. Also mentions occasional marijuana use. Denies additional drug use. No alcohol. UDS pending. Patient states based on his current stressors, yesterday he started experiencing suicidal ideations with a plan to jump off a bridge on the highway or trying to "think of the quickest way I could". Patient denies past suicide attempts. Denies active suicidal ideations. No non-suicidal self-injurious behaviors. Patient states if he does not have a place  to go upon discharge from  hospital that he has "made my mind up". He does not elaborate. To clarify, patient is asked what would make him feel better or not want to harm himself he shares "a place to stay. I know I can't stay here forever". No homicidal ideations. Again, no active SI at this time.   Patient denies need for inpatient psychiatric admission. He denies history of admissions however chart review appears he was admitted in June 2024 at [facility]. He states he is "not crazy" and does not need mental health treatment at this time. Shares he only needs housing resources.     PAST PSYCHIATRIC HISTORY  Reports no prior psychiatric hospitalizations although per chart review was admitted in June 2024 to [facility] and discharged on Prozac 20mg  po daily, Hydroxyzine 25mg  po TID PRN for anxiety, and Trazodone 50mg  po at bedtime PRN for insomnia. Had agreed to follow with [facility] rehab program upon discharge. [facility] for outpatient follow-up.   No outpatient treatment currently. No current psychotropic medications.   Otherwise as per HPI above.  PAST MEDICAL HISTORY  Past Medical History:  Diagnosis Date   Glaucoma    Hyperlipemia    Hypertension      HOME MEDICATIONS  Facility Ordered Medications  Medication   [COMPLETED] ketorolac (TORADOL) 30 MG/ML injection 30 mg   [COMPLETED] morphine (PF) 4 MG/ML injection 4 mg   [COMPLETED] predniSONE (DELTASONE) tablet 60 mg   [COMPLETED] acetaminophen (TYLENOL) tablet 1,000 mg   oxyCODONE-acetaminophen (PERCOCET/ROXICET) 5-325 MG per tablet 1 tablet   predniSONE (DELTASONE) tablet 50 mg   PTA Medications  Medication Sig   amLODipine (NORVASC) 5 MG tablet Take 1 tablet (5 mg total) by mouth daily.   FLUoxetine (PROZAC) 20 MG capsule Take 1 capsule (20 mg total) by mouth daily.   traZODone (DESYREL) 50 MG tablet Take 1 tablet (50 mg total) by mouth at bedtime as needed for sleep.     ALLERGIES  No Known Allergies  SOCIAL & SUBSTANCE USE HISTORY  Social  History   Socioeconomic History   Marital status: Single    Spouse name: Not on file   Number of children: Not on file   Years of education: Not on file   Highest education level: Not on file  Occupational History   Not on file  Tobacco Use   Smoking status: Every Day    Current packs/day: 0.50    Types: Cigarettes   Smokeless tobacco: Never  Substance and Sexual Activity   Alcohol use: No   Drug use: No   Sexual activity: Not on file  Other Topics Concern   Not on file  Social History Narrative   Not on file   Social Drivers of Health   Financial Resource Strain: Not on file  Food Insecurity: Food Insecurity Present (12/15/2022)   Hunger Vital Sign    Worried About Running Out of Food in the Last Year: Sometimes true    Ran Out of Food in the Last Year: Sometimes true  Transportation Needs: Unmet Transportation Needs (12/15/2022)   PRAPARE - Administrator, Civil Service (Medical): Yes    Lack of Transportation (Non-Medical): Yes  Physical Activity: Not on file  Stress: Not on file  Social Connections: Unknown (11/03/2021)   [facility] from [facility]   Social Network    Social Network: Not on file   Social History   Tobacco Use  Smoking Status Every Day   Current packs/day: 0.50   Types: Cigarettes  Smokeless Tobacco Never   Social History   Substance and Sexual Activity  Alcohol Use No   Social History   Substance and Sexual Activity  Drug Use No    Additional pertinent information: homelessness   FAMILY HISTORY  History reviewed. No pertinent family history. Family Psychiatric History (if known):  None disclosed at this time.   MENTAL STATUS EXAM (MSE)  Mental Status Exam: General Appearance: Fairly Groomed  Orientation:  Full (Time, Place, and Person)  Memory:  Recent;   Good  Concentration:  Concentration: Good  Recall:  Fair  Attention  Good  Eye Contact:  Good  Speech:  Clear and Coherent  Language:  Good   Volume:  Normal  Mood: Depressed  Affect:  Congruent  Thought Process:  Linear  Thought Content:  Logical  Suicidal Thoughts:   Passive SI with plan. No active SI. No current plan. SI conditional upon homelessness.   Homicidal Thoughts:  No  Judgement:  Poor  Insight:  Lacking  Psychomotor Activity:  Normal  Akathisia:  No  Fund of Knowledge:  Good    Assets:  Communication Skills Desire for Improvement Physical Health  Cognition:  WNL  ADL's:  Intact  AIMS (if indicated):       VITALS  Blood pressure 123/83, pulse 79, temperature 98.2 F (36.8 C), temperature source Oral, resp. rate 20, SpO2 98%.  LABS  Admission on 07/27/2023  Component Date Value Ref Range Status   Glucose-Capillary 07/27/2023 157 (H)  70 - 99 mg/dL Final   Glucose reference range applies only to samples taken after fasting for at least 8 hours.   WBC 07/27/2023 5.0  4.0 - 10.5 K/uL Final   RBC 07/27/2023 5.17  4.22 - 5.81 MIL/uL Final   Hemoglobin 07/27/2023 14.5  13.0 - 17.0 g/dL Final   HCT 96/09/5407 43.0  39.0 - 52.0 % Final   MCV 07/27/2023 83.2  80.0 - 100.0 fL Final   MCH 07/27/2023 28.0  26.0 - 34.0 pg Final   MCHC 07/27/2023 33.7  30.0 - 36.0 g/dL Final   RDW 81/19/1478 14.3  11.5 - 15.5 % Final   Platelets 07/27/2023 191  150 - 400 K/uL Final   nRBC 07/27/2023 0.0  0.0 - 0.2 % Final   Neutrophils Relative % 07/27/2023 81  % Final   Neutro Abs 07/27/2023 4.0  1.7 - 7.7 K/uL Final   Lymphocytes Relative 07/27/2023 12  % Final   Lymphs Abs 07/27/2023 0.6 (L)  0.7 - 4.0 K/uL Final   Monocytes Relative 07/27/2023 6  % Final   Monocytes Absolute 07/27/2023 0.3  0.1 - 1.0 K/uL Final   Eosinophils Relative 07/27/2023 1  % Final   Eosinophils Absolute 07/27/2023 0.1  0.0 - 0.5 K/uL Final   Basophils Relative 07/27/2023 0  % Final   Basophils Absolute 07/27/2023 0.0  0.0 - 0.1 K/uL Final   Immature Granulocytes 07/27/2023 0  % Final   Abs Immature Granulocytes 07/27/2023 0.01  0.00 - 0.07 K/uL  Final   Performed at [facility], [address] [place], [place] [address]   Sodium 07/27/2023 136  135 - 145 mmol/L Final   Potassium 07/27/2023 3.7  3.5 - 5.1 mmol/L Final   Chloride 07/27/2023 104  98 - 111 mmol/L Final   CO2 07/27/2023 22  22 - 32 mmol/L Final   Glucose, Bld 07/27/2023 170 (H)  70 - 99 mg/dL Final   Glucose reference range applies only to samples  taken after fasting for at least 8 hours.   BUN 07/27/2023 14  6 - 20 mg/dL Final   Creatinine, Ser 07/27/2023 1.01  0.61 - 1.24 mg/dL Final   Calcium 16/03/9603 8.7 (L)  8.9 - 10.3 mg/dL Final   Total Protein 54/02/8118 6.3 (L)  6.5 - 8.1 g/dL Final   Albumin 14/78/2956 3.4 (L)  3.5 - 5.0 g/dL Final   AST 21/30/8657 39  15 - 41 U/L Final   ALT 07/27/2023 25  0 - 44 U/L Final   Alkaline Phosphatase 07/27/2023 50  38 - 126 U/L Final   Total Bilirubin 07/27/2023 0.8  0.0 - 1.2 mg/dL Final   GFR, Estimated 07/27/2023 >60  >60 mL/min Final   Comment: (NOTE) Calculated using the CKD-EPI Creatinine Equation (2021)    Anion gap 07/27/2023 10  5 - 15 Final   Performed at [facility], [address], [place], [place] [address]   Acetaminophen (Tylenol), Serum 07/27/2023 10  10 - 30 ug/mL Final   Comment: (NOTE) Therapeutic concentrations vary significantly. A range of 10-30 ug/mL  may be an effective concentration for many patients. However, some  are best treated at concentrations outside of this range. Acetaminophen concentrations >150 ug/mL at 4 hours after ingestion  and >50 ug/mL at 12 hours after ingestion are often associated with  toxic reactions.  Performed at [facility], [address], [place], [place] [address]    Salicylate Lvl 07/27/2023 <7.0 (L)  7.0 - 30.0 mg/dL Final   Performed at [facility], [address], [place], [place] [address]   Alcohol, Ethyl (B) 07/27/2023 <10  <10 mg/dL Final   Comment: (NOTE) Lowest detectable limit for serum alcohol is 10 mg/dL.  For medical  purposes only. Performed at [facility], [address] [place], [place] [address]     PSYCHIATRIC REVIEW OF SYSTEMS (ROS)  ROS: Notable for the following relevant positive findings: Review of Systems  Psychiatric/Behavioral:  Positive for depression and substance abuse.     Additional findings:      Musculoskeletal: No abnormal movements observed      Gait & Station: Laying/Sitting      Pain Screening: Present - mild to moderate      Nutrition & Dental Concerns: No concerns at this time   RISK FORMULATION/ASSESSMENT  Is the patient experiencing any suicidal or homicidal ideations: Yes       Explain if yes: Passive SI with plan to jump off bridge. Last ideation yesterday. No active SI/HI. SI conditional upon homelessness.   Protective factors considered for safety management: access to care, no past suicide attempts  Risk factors/concerns considered for safety management: homelessness  Depression Substance abuse/dependence Physical illness/chronic pain Hopelessness Unwillingness to seek help Male gender Unmarried  Is there a safety management plan with the patient and treatment team to minimize risk factors and promote protective factors: Yes           Explain: Patient currently in the ED, comfort measures. Case management/ SW to assist with substance abuse treatment options, shelter placement assistance.  Is crisis care placement or psychiatric hospitalization recommended: No     Based on my current evaluation and risk assessment, patient is determined at this time to be at:  Moderate Risk  *RISK ASSESSMENT Risk assessment is a dynamic process; it is possible that this patient's condition, and risk level, may change. This should be re-evaluated and managed over time as appropriate. Please  re-consult psychiatric consult services if additional assistance is needed in terms of risk assessment and management. If your team decides to discharge this patient, please advise  the patient how to best access emergency psychiatric services, or to call 911, if their condition worsens or they feel unsafe in any way.   Assunta Gambles, NP Telepsychiatry Consult Services

## 2023-07-28 NOTE — ED Provider Notes (Signed)
Patient accepted to [facility] inpatient treatment by Dr. Rex Kras. Emtala completed.    Rondel Baton, MD 07/28/23 1240

## 2023-07-28 NOTE — Group Note (Signed)
Date:  07/28/2023 Time:  11:05 PM  Group Topic/Focus:  Wrap-Up Group:   The focus of this group is to help patients review their daily goal of treatment and discuss progress on daily workbooks.    Participation Level:  Did Not Attend   Brian Daugherty 07/28/2023, 11:05 PM

## 2023-07-28 NOTE — ED Notes (Signed)
Report was given to Arnette Felts, RN at 1600 from [facility].

## 2023-07-28 NOTE — ED Provider Notes (Signed)
I went to discharge the patient.  Says that he is having ongoing persistent thoughts of jumping in front of traffic.  Unclear if this is truly suicidal ideation or just for secondary gain.  Based on the notes last night it appears that he was not actively suicidal when TTS had evaluated him.  Does not have frequent visits for this.  He also says that he feels like he did not fully understand the treatments for his mental condition that were available.  I requested that psychiatry reevaluate him.  They have seen the patient and feel that he now may need inpatient treatment.   Rondel Baton, MD 07/28/23 1044

## 2023-07-28 NOTE — Consult Note (Signed)
[facility] Psychiatric Consult Initial  Patient Name: .Brian Daugherty  MRN: [id number]  DOB: 1965-11-28  Consult Order details:  Orders (From admission, onward)     Start     Ordered   07/28/23 1037  CONSULT TO CALL ACT TEAM       Ordering Provider: Rondel Baton, MD  Provider:  (Not yet assigned)  Question:  Reason for Consult?  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  homelessness 3.    Assessment  Brian Daugherty is a 58 y.o. male admitted: Presented to the EDfor 07/27/2023  8:49 AM for suicidal ideations, cocaine use, and recent homelessness. He carries the psychiatric diagnoses of substance induced mood disorder.   On initial examination, patient is depressed, crying, and still endorsing active suicidal ideations with a plan to kill himself. Please see plan below for detailed recommendations.   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 Patient is under voluntary admission status at this time; please IVC if attempts to leave hospital.  ## Behavioral / Environmental: - No specific recommendations at this time.     ## Safety and Observation Level:  - Based on my clinical  evaluation, I estimate the patient to be at low risk of self harm in the current setting. - At this time, we recommend  routine. This decision is based on my review of the chart including patient's history and current presentation, interview of the patient, mental status examination, and consideration of suicide risk including evaluating suicidal ideation, plan, intent, suicidal or self-harm behaviors, risk factors, and protective factors. This judgment is based on our ability to directly address suicide risk, implement suicide prevention strategies, and develop a safety plan while the patient is in the clinical setting. Please contact our team if there is a concern that risk level has changed.  CSSR Risk Category:C-SSRS RISK CATEGORY: No Risk  Suicide Risk Assessment: Patient has following modifiable risk factors for suicide: active suicidal ideation, untreated depression, and social isolation, which we are addressing by inpatient treatment. Patient has following non-modifiable or demographic risk factors for suicide: male gender Patient has the following protective factors against suicide: Access to outpatient mental health care  Thank you for this consult request. Recommendations have been communicated to the primary team.  We will recommend IP at this time at this time.   Eligha Bridegroom, NP       History of Present Illness  Relevant Aspects of Hospital ED Course:  Brian Daugherty is a 58 y.o. male history of chronic lower back pain with right extremity radiculopathy followed by [facility] here for evaluation of right leg pain.  Patient states he woke up this morning and had more than  normal right leg pain.  States he recently had an MRI which showed impingement on his nerves on the right.  He denies any recent falls or injuries.  No numbness, weakness.  No bowel or bladder incontinence, saddle paresthesia, history of IVDU, fever. No abd pain, hx of known AAA, dissection.  States has been taking  the medications as directed and still having pain.  He states he has been out of work for quite some time due to the pain.  PE or DVT.  No recent surgery, immobilization or malignancy.  No recent illnesses.     Patient notes that he is suicidal.  He has increasing depression due to decrease in mobility and chronic pain to his back.  He is afraid if he goes home he will overdose on medications.  No history of similar he does have a history of cocaine abuse which she states he has no longer been using.  No HI, AVH.  Patient Report:  Patient seen at [facility], ED for psychiatric evaluation.  During evaluation patient is withdrawn, tearful/crying, and pleading for help.  Patient stated over the past couple months his mother passed away, he was in a interesting housing situation where he lived with numerous different people in 1 house.  He did not go into details but essentially they were he evicted from the house and he has now been homeless for the past few days.  He has never been homeless before, has never had to live on the streets.  Patient stated he is extremely depressed and has no reason to live.  His mother was his only family member he spoke to.  He is very isolated and lonely and cannot identify any reasons to live.  Patient stated he will either acquire drugs to overdose or he will go by a knife and run himself into it.  Patient is crying as he states this.  He denies HI.  Denies any auditory visual hallucinations.  He does endorse 1 previous suicide attempt around 10 years ago where he did try to overdose.  Has never seen a psychiatrist nor received treatment.  Has never taken any psychotropic medications and still does not want to.  Patient has been using cocaine almost daily for the past few years.  He is drinking occasionally, denies daily alcohol consumption.  He also endorses marijuana use.  Ultimately patient is requesting help to achieve sobriety, address his depression, and help get him set  up with outpatient resources and "get back on his feet".  Patient continues to endorse active suicidal ideations, he is unable to contract for safety and states he will try and kill himself if discharged today.  Psych ROS:  Depression: yes Anxiety:  yes Mania (lifetime and current): denies Psychosis: (lifetime and current): denies   Review of Systems  Psychiatric/Behavioral:  Positive for depression, substance abuse and suicidal ideas.   All other systems reviewed and are negative.    Psychiatric and Social History  Psychiatric History:  Information collected from patient, chart  Prev Dx/Sx: substance induced mood disorder Current Psych Provider: none Home Meds (current): none Previous Med Trials: none Therapy: none   Social History:  Developmental Hx: wdl Educational Hx: high school Occupational Hx: unemployed Armed forces operational officer Hx: none Living Situation: recent homeless Spiritual Hx: yes Access to weapons/lethal means: denies direct access but states it could be easily obtainable   Substance History Alcohol: socially, few times a week Tobacco: yes Illicit drugs: cocaine, thc Prescription drug abuse: denies Rehab hx:  denies  Exam Findings  Physical Exam:  Vital Signs:  Temp:  [98.2 F (36.8 C)-98.3 F (36.8 C)] 98.3 F (36.8 C) (02/03 0628) Pulse Rate:  [75-84] 75 (02/03 0628) Resp:  [16-20] 16 (02/03 0628) BP: (116-134)/(73-86) 128/86 (02/03 0628) SpO2:  [97 %-99 %] 97 % (02/03 0628) Blood pressure 128/86, pulse 75, temperature 98.3 F (36.8 C), temperature source Oral, resp. rate 16, SpO2 97%. There is no height or weight on file to calculate BMI.  Physical Exam Neurological:     Mental Status: He is alert and oriented to person, place, and time.  Psychiatric:        Attention and Perception: Attention normal.        Mood and Affect: Mood is depressed. Affect is tearful.        Speech: Speech normal.        Behavior: Behavior is cooperative.        Thought  Content: Thought content includes suicidal ideation. Thought content includes suicidal plan.     Mental Status Exam: General Appearance: Fairly Groomed  Orientation:  Full (Time, Place, and Person)  Memory:  Immediate;   Good Recent;   Good  Concentration:  Concentration: Good  Recall:  Good  Attention  Fair  Eye Contact:  Fair  Speech:  Clear and Coherent  Language:  Fair  Volume:  Normal  Mood: "really depressed"  Affect:  Congruent  Thought Process:  Coherent  Thought Content:  WDL  Suicidal Thoughts:  Yes.  with intent/plan  Homicidal Thoughts:  No  Judgement:  Fair  Insight:  Fair  Psychomotor Activity:  Normal  Akathisia:  No  Fund of Knowledge:  Fair      Assets:  Communication Skills Desire for Improvement Physical Health Resilience  Cognition:  WNL  ADL's:  Intact  AIMS (if indicated):        Other History   These have been pulled in through the EMR, reviewed, and updated if appropriate.  Family History:  The patient's family history is not on file.  Medical History: Past Medical History:  Diagnosis Date   Glaucoma    Hyperlipemia    Hypertension     Surgical History: Past Surgical History:  Procedure Laterality Date   LEG SURGERY     LUNG SURGERY       Medications:   Current Facility-Administered Medications:    hydrOXYzine (ATARAX) tablet 25 mg, 25 mg, Oral, TID PRN, Hunt, Katlin E, NP   OLANZapine (ZYPREXA) injection 10 mg, 10 mg, Intramuscular, Once PRN, Hunt, Katlin E, NP   oxyCODONE-acetaminophen (PERCOCET/ROXICET) 5-325 MG per tablet 1 tablet, 1 tablet, Oral, Q6H PRN, Henderly, Britni A, PA-C, 1 tablet at 07/27/23 1955  Current Outpatient Medications:    lidocaine (LIDODERM) 5 %, Place 1 patch onto the skin daily. Remove & Discard patch within 12 hours or as directed by MD, Disp: 30 patch, Rfl: 0   predniSONE (STERAPRED UNI-PAK 21 TAB) 10 MG (21) TBPK tablet, Take by mouth daily. Take 6 tabs by mouth daily  for 1 days, then 5 tabs  for 1 days, then 4 tabs for 1 days, then 3 tabs for 1 days, 2 tabs for 1 days, then 1 tab by mouth daily for 1 days, Disp: 21 tablet, Rfl: 0   amLODipine (NORVASC) 5 MG tablet, Take 1 tablet (5 mg total) by mouth daily., Disp: 30 tablet, Rfl: 0   FLUoxetine (PROZAC) 20 MG capsule, Take 1 capsule (20 mg total) by mouth daily.,  Disp: 30 capsule, Rfl: 0   traZODone (DESYREL) 50 MG tablet, Take 1 tablet (50 mg total) by mouth at bedtime as needed for sleep., Disp: 30 tablet, Rfl: 0  Allergies: No Known Allergies  Eligha Bridegroom, NP

## 2023-07-28 NOTE — Progress Notes (Signed)
Pt was accepted to [facility]  07/28/2023  Bed Assignment 312  Address: [address] [place], [place], [place] [address]  BMU FAX Number [phone]  Pt meets inpatient criteria per: Eligha Bridegroom NP  Attending Physician will be Dr. Callie Fielding  Report can be called to: [phone]  Pt can arrive after ASAP   Care Team notified:Danika [facility] RN, Eligha Bridegroom NP, Florentina Addison RN, [facility] RN, Arnette Felts RN   [place] Yolander Goodie, MSW, [place] 07/28/2023 1:05 PM

## 2023-07-28 NOTE — ED Provider Notes (Addendum)
Emergency Medicine Observation Re-evaluation Note  Brian Daugherty is a 58 y.o. male, seen on rounds today.  Pt initially presented to the ED for complaints of Leg Pain and Suicidal (Ideation) Currently, the patient is resting comfortably.  Complaining of back pain which he says is consistent with his sciatica.  Physical Exam  BP 128/86 (BP Location: Right Arm)   Pulse 75   Temp 98.3 F (36.8 C) (Oral)   Resp 16   SpO2 97%  Physical Exam General: Resting comfortably No spinal midline TTP in cervical, thoracic, or lumbar spine. No stepoffs noted. Motor: Muscle bulk and tone are normal. Strength is 5/5 in hip flexion, knee flexion and extension, ankle dorsiflexion and plantar flexion bilaterally. Full strength of great toe dorsiflexion bilaterally. Sensory: Intact sensation to light touch in L2 though S1 dermatomes bilaterally.  Reflexes: Patellar 2+ bilaterally, Achilles 2+ bilaterally, no ankle clonus bilaterally Psych: Calm and cooperative  ED Course / MDM  EKG:   I have reviewed the labs performed to date as well as medications administered while in observation.  Recent changes in the last 24 hours include seen and cleared by psychiatry.  Plan  Current plan is for discharge.  Per psychiatry notes, appears that patient suicidality was based on his homelessness and housing insecurity.  He declined scheduled psychiatric medications and inpatient treatment which is also consistent with secondary gain.  Seen by TTS who has placed information about substance abuse treatment programs.  Oxycodone discontinued.  Given a dose of his gabapentin today as well as information about shelters in the area.   Rondel Baton, MD 07/28/23 [phone]

## 2023-07-28 NOTE — Plan of Care (Signed)
  Problem: Education: Goal: Emotional status will improve Outcome: Not Progressing Goal: Mental status will improve Outcome: Not Progressing   Problem: Coping: Goal: Coping ability will improve Outcome: Not Progressing   

## 2023-07-28 NOTE — Progress Notes (Signed)
Admission Note:  58 yr old AA male who presents voluntary in no acute physical distress  for the treatment of SI and Depression. Pt appears flat and depressed, crying 'I don't want to be here anymore, I can't live like this". Pt was  cooperative with admission process. Pt presents with SI, denied having a plan but, stated he's hopeless and know he will do it when he leaves".  Pt contracts for safety upon admission. Pt report A/H telling him to kill himself. Pt also stated his stressors are losing his mom 1 yr ago and is now homeless without any support. Skin was assessed and found to be clear except for surgical scar to R breast and left knee. Pt also admit to having HTN and uses Cocaine and Cannabis. PT searched and no contraband found, POC and unit policies explained and understanding verbalized. Consents obtained. Food and fluids offered, and  accepted. Pt had no additional questions or concerns Pt is managed on q 15 min checks.

## 2023-07-28 NOTE — Tx Team (Signed)
Initial Treatment Plan 07/28/2023 6:34 PM Ramirez Fullbright ZOX:[id number]    PATIENT STRESSORS: Financial difficulties   Traumatic event     PATIENT STRENGTHS: Average or above average intelligence  Capable of independent living    PATIENT IDENTIFIED PROBLEMS: Homeless  Loss of mother                   DISCHARGE CRITERIA:  Improved stabilization in mood, thinking, and/or behavior  PRELIMINARY DISCHARGE PLAN: Outpatient therapy  PATIENT/FAMILY INVOLVEMENT: This treatment plan has been presented to and reviewed with the patient, Brian Daugherty.  The patient has been given the opportunity to ask questions and make suggestions.  Gardiner Barefoot, RN 07/28/2023, 6:34 PM

## 2023-07-29 DIAGNOSIS — G894 Chronic pain syndrome: Secondary | ICD-10-CM | POA: Insufficient documentation

## 2023-07-29 DIAGNOSIS — F1494 Cocaine use, unspecified with cocaine-induced mood disorder: Secondary | ICD-10-CM | POA: Diagnosis not present

## 2023-07-29 MED ORDER — ADULT MULTIVITAMIN W/MINERALS CH
1.0000 | ORAL_TABLET | Freq: Every day | ORAL | Status: DC
  Administered 2023-07-29 – 2023-08-05 (×8): 1 via ORAL
  Filled 2023-07-29 (×8): qty 1

## 2023-07-29 MED ORDER — GABAPENTIN 100 MG PO CAPS
200.0000 mg | ORAL_CAPSULE | Freq: Every morning | ORAL | Status: DC
  Administered 2023-07-30 – 2023-08-04 (×6): 200 mg via ORAL
  Filled 2023-07-29 (×6): qty 2

## 2023-07-29 MED ORDER — DULOXETINE HCL 30 MG PO CPEP
30.0000 mg | ORAL_CAPSULE | Freq: Every day | ORAL | Status: DC
  Administered 2023-07-30: 30 mg via ORAL
  Filled 2023-07-29: qty 1

## 2023-07-29 MED ORDER — MELATONIN 5 MG PO TABS
5.0000 mg | ORAL_TABLET | Freq: Every day | ORAL | Status: DC
Start: 2023-07-29 — End: 2023-08-05
  Administered 2023-07-29 – 2023-08-04 (×7): 5 mg via ORAL
  Filled 2023-07-29 (×7): qty 1

## 2023-07-29 MED ORDER — LIDOCAINE VISCOUS HCL 2 % MT SOLN
15.0000 mL | Freq: Four times a day (QID) | OROMUCOSAL | Status: AC | PRN
  Administered 2023-07-30 – 2023-08-01 (×4): 15 mL via OROMUCOSAL
  Filled 2023-07-29 (×5): qty 15

## 2023-07-29 MED ORDER — IBUPROFEN 600 MG PO TABS
600.0000 mg | ORAL_TABLET | Freq: Three times a day (TID) | ORAL | Status: DC | PRN
  Administered 2023-07-30 – 2023-08-02 (×7): 600 mg via ORAL
  Filled 2023-07-29 (×7): qty 1

## 2023-07-29 NOTE — Progress Notes (Signed)
 NUTRITION ASSESSMENT  Pt identified as at risk on the Malnutrition Screen Tool  INTERVENTION:  -Continue Ensure Enlive po BID, each supplement provides 350 kcal and 20 grams of protein -MVI with minerals daily  -Continue regular diet   NUTRITION DIAGNOSIS: Unintentional weight loss related to sub-optimal intake as evidenced by pt report.   Goal: Pt to meet >/= 90% of their estimated nutrition needs.  Monitor:  PO intake  Assessment:  Pt presented with initial concerns for right leg pain. Pt reports history of chronic pain issue as well as endorsed suicidal ideations, increasing depression due to decreased mobility and chronic back pain. He reported feeling afraid to discharge with possibility of overdose.   Pt admitted with SI.   Per chart review, pt with depressive disorder, cocaine use, homelessness, and SI. Currently uses cocaine weekly. His largest stressors are homelessness and grief from losing his mother 1 year ago.   Pt currently on a regular diet. No meal completion data available at this time.   Reviewed wt hx; pt has experienced 7.2% wt loss over the past year, which is not significant for time frame. Suspect weight loss may be related to homelessness.   Medications reviewed.   Labs reviewed: CBGS: 157 (inpatient orders for glycemic control are none). Tox screen positive for opiates, cocaine, and tetrahydrocannabinol.   58 y.o. male  Height: Ht Readings from Last 1 Encounters:  07/28/23 5' 11 (1.803 m)    Weight: Wt Readings from Last 1 Encounters:  07/28/23 73.7 kg    Weight Hx: Wt Readings from Last 10 Encounters:  07/28/23 73.7 kg  12/15/22 78.9 kg  12/14/22 79.4 kg  08/12/22 79.4 kg  08/09/22 79.4 kg  01/17/22 77.1 kg  03/08/21 74.8 kg  07/23/20 81.6 kg  07/18/18 86.2 kg  07/16/18 86.2 kg    BMI:  Body mass index is 22.66 kg/m. BMI WDL.   Estimated Nutritional Needs: Kcal: 25-30 kcal/kg Protein: > 1 gram protein/kg Fluid: 1  ml/kcal  Diet Order:  Diet Order             Diet regular Room service appropriate? Yes; Fluid consistency: Thin  Diet effective now                  Pt is also offered choice of unit snacks mid-morning and mid-afternoon.  Pt is eating as desired.   Lab results and medications reviewed.   Margery ORN, RD, LDN, CDCES Registered Dietitian III Certified Diabetes Care and Education Specialist If unable to reach this RD, please use RD Inpatient group chat on secure chat between hours of 8am-4 pm daily

## 2023-07-29 NOTE — Plan of Care (Signed)
   Problem: Education: Goal: Emotional status will improve Outcome: Not Progressing Goal: Mental status will improve Outcome: Not Progressing

## 2023-07-29 NOTE — Group Note (Signed)
 Date:  07/29/2023 Time:  10:51 AM  Group Topic/Focus:   Goals Group:   The focus of this group is to help patients establish daily goals to achieve during treatment and discuss how the patient can incorporate goal setting into their daily lives to aide in recovery.  Managing Feelings:   The focus of this group is to identify what feelings patients have difficulty handling and develop a plan to handle them in a healthier way upon discharge.   Participation Level:  Did Not Attend  Shamika Pedregon A Rufino Staup 07/29/2023, 10:51 AM

## 2023-07-29 NOTE — H&P (Signed)
 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 The patient states that he woke up this morning with increased right leg pain despite taking medications as prescribed. He recently had an MRI, which showed nerve impingement on the right. He denies recent falls, injuries, numbness, weakness, bowel or bladder incontinence, saddle paresthesia, history of IV drug use, fever, abdominal pain, or history of known AAA/dissection. He has been out of work for an extended period due to his chronic pain.The patient expresses active suicidal ideation (SI), citing worsening depression due to decreased mobility and chronic pain. He states that he fears overdosing on his medications if discharged today. He reports a history of cocaine use disorder, stating that he has been using cocaine almost daily for the past few years, drinks alcohol occasionally (not daily), and uses marijuana. He denies homicidal ideation (HI) and auditory/visual hallucinations (AVH)Psychiatric history is significant for a prior suicide attempt approximately 10 years ago via medication overdose. However, he has never received psychiatric treatment or taken psychotropic medications.The patient describes significant recent psychosocial stressors, including the death of his mother a few months ago,  eviction from his living situation, and now experiencing homelessness for the first time. He reports feeling isolated and without support, stating that his mother was the only family member he spoke to. He is visibly distressed, tearful, and states, "I have no reason to live." He describes plans to overdose on drugs or stab himself with a knife if discharged.The patient is requesting help for his depression, achieving sobriety, and securing outpatient resources to regain stability. Due to his active SI and inability to contract for safety, inpatient psychiatric hospitalization is necessary. Associated Signs/Symptoms: Depression Symptoms:  anhedonia, insomnia, feelings of worthlessness/guilt, hopelessness, suicidal thoughts with specific plan, loss of energy/fatigue, weight loss, decreased appetite, (Hypo) Manic Symptoms:  Impulsivity, Anxiety Symptoms:  Excessive Worry, Psychotic Symptoms:   none reported PTSD Symptoms: Negative Total Time spent with patient: 2.5 hours  Past Psychiatric History: MDD Polysubstance Abuse  Is the patient at risk to self? Yes.    Has the patient been a risk to self in the past 6 months? Yes.    Has the patient been a risk to self within the distant past? Yes.    Is the patient a risk to others? No.  Has the patient been a risk to others in the past 6 months? No.  Has the patient been a risk to others within the distant past? No.   Columbia Scale:  Flowsheet Row Admission (Current) from 07/28/2023 in [facility]PAT[facility] BEHAV[facility] ED from 07/27/2023 in [facility] Emergency Department at [facility] (Dis[facility]  C-SSRS RISK CATEGORY Low Risk No Risk High Risk        Prior Inpatient Therapy: No. If yes, describe none   Prior Outpatient Therapy: Yes.   If yes, describe    Alcohol Screening: 1. How often do you have a  drink containing alcohol?: Never 2. How many drinks  containing alcohol do you have on a typical day when you are drinking?: 1 or 2 3. How often do you have six or more drinks on one occasion?: Never AUDIT-C Score: 0 4. How often during the last year have you found that you were not able to stop drinking once you had started?: Never 5. How often during the last year have you failed to do what was normally expected from you because of drinking?: Never 6. How often during the last year have you needed a first drink in the morning to get yourself going after a heavy drinking session?: Never 7. How often during the last year have you had a feeling of guilt of remorse after drinking?: Never 8. How often during the last year have you been unable to remember what happened the night before because you had been drinking?: Never 9. Have you or someone else been injured as a result of your drinking?: No 10. Has a relative or friend or a doctor or another health worker been concerned about your drinking or suggested you cut down?: No Alcohol Use Disorder Identification Test Final Score (AUDIT): 0 Alcohol Brief Interventions/Follow-up: Alcohol education/Brief advice Substance Abuse History in the last 12 months:  Yes.   Consequences of Substance Abuse: Legal Consequences:  Eviction Previous Psychotropic Medications: Yes  Psychological Evaluations: Yes  Past Medical History:  Past Medical History:  Diagnosis Date   Glaucoma    Hyperlipemia    Hypertension     Past Surgical History:  Procedure Laterality Date   LEG SURGERY     LUNG SURGERY     Family History: History reviewed. No pertinent family history. Family Psychiatric  History: none reported Tobacco Screening:  Social History   Tobacco Use  Smoking Status Every Day   Current packs/day: 0.50   Types: Cigarettes  Smokeless Tobacco Never    BH Tobacco Counseling     Are you interested in Tobacco Cessation Medications?  Yes, implement Nicotene Replacement Protocol Counseled patient on  smoking cessation:  Yes Reason Tobacco Screening Not Completed: No value filed.       Social History:  Social History   Substance and Sexual Activity  Alcohol Use No     Social History   Substance and Sexual Activity  Drug Use Yes   Types: Cocaine, Marijuana    Additional Social History: Marital status: Single Are you sexually active?: Yes What is your sexual orientation?: Heterosexual Has your sexual activity been affected by drugs, alcohol, medication, or emotional stress?: None reported Does patient have children?: Yes How many children?: 51 (80 year old) How is patient's relationship with their children?: We cool...good.                         Allergies:  No Known Allergies Lab Results:  Results for orders placed or performed during the hospital encounter of 07/27/23 (from the past 48 hours)  Rapid urine drug screen (hospital performed)     Status: Abnormal   Collection Time: 07/27/23  5:59 PM  Result Value Ref Range   Opiates POSITIVE (A) NONE DETECTED   Cocaine POSITIVE (A) NONE DETECTED   Benzodiazepines NONE DETECTED NONE DETECTED   Amphetamines NONE DETECTED NONE DETECTED   Tetrahydrocannabinol POSITIVE (A) NONE DETECTED   Barbiturates NONE DETECTED NONE DETECTED    Comment: (NOTE) DRUG SCREEN FOR MEDICAL PURPOSES ONLY.  IF CONFIRMATION IS NEEDED FOR ANY PURPOSE, NOTIFY LAB WITHIN  5 DAYS.  LOWEST DETECTABLE LIMITS FOR URINE DRUG SCREEN Drug Class                     Cutoff (ng/mL) Amphetamine and metabolites    1000 Barbiturate and metabolites    200 Benzodiazepine                 200 Opiates and metabolites        300 Cocaine and metabolites        300 THC                            50 Performed at [facility], [address] [place], [place] [address]   CBC with Differential     Status: Abnormal   Collection Time: 07/27/23  6:33 PM  Result Value Ref Range   WBC 5.0 4.0 - 10.5 K/uL   RBC 5.17 4.22 - 5.81 MIL/uL   Hemoglobin  14.5 13.0 - 17.0 g/dL   HCT 56.9 60.9 - 47.9 %   MCV 83.2 80.0 - 100.0 fL   MCH 28.0 26.0 - 34.0 pg   MCHC 33.7 30.0 - 36.0 g/dL   RDW 85.6 88.4 - 84.4 %   Platelets 191 150 - 400 K/uL   nRBC 0.0 0.0 - 0.2 %   Neutrophils Relative % 81 %   Neutro Abs 4.0 1.7 - 7.7 K/uL   Lymphocytes Relative 12 %   Lymphs Abs 0.6 (L) 0.7 - 4.0 K/uL   Monocytes Relative 6 %   Monocytes Absolute 0.3 0.1 - 1.0 K/uL   Eosinophils Relative 1 %   Eosinophils Absolute 0.1 0.0 - 0.5 K/uL   Basophils Relative 0 %   Basophils Absolute 0.0 0.0 - 0.1 K/uL   Immature Granulocytes 0 %   Abs Immature Granulocytes 0.01 0.00 - 0.07 K/uL    Comment: Performed at [facility], [address], [place], [place] [address]  Comprehensive metabolic panel     Status: Abnormal   Collection Time: 07/27/23  6:33 PM  Result Value Ref Range   Sodium 136 135 - 145 mmol/L   Potassium 3.7 3.5 - 5.1 mmol/L   Chloride 104 98 - 111 mmol/L   CO2 22 22 - 32 mmol/L   Glucose, Bld 170 (H) 70 - 99 mg/dL    Comment: Glucose reference range applies only to samples taken after fasting for at least 8 hours.   BUN 14 6 - 20 mg/dL   Creatinine, Ser 8.98 0.61 - 1.24 mg/dL   Calcium 8.7 (L) 8.9 - 10.3 mg/dL   Total Protein 6.3 (L) 6.5 - 8.1 g/dL   Albumin 3.4 (L) 3.5 - 5.0 g/dL   AST 39 15 - 41 U/L   ALT 25 0 - 44 U/L   Alkaline Phosphatase 50 38 - 126 U/L   Total Bilirubin 0.8 0.0 - 1.2 mg/dL   GFR, Estimated >39 >39 mL/min    Comment: (NOTE) Calculated using the CKD-EPI Creatinine Equation (2021)    Anion gap 10 5 - 15    Comment: Performed at [facility], [address] [place], [place] [address]  Acetaminophen  level     Status: None   Collection Time: 07/27/23  6:33 PM  Result Value Ref Range   Acetaminophen  (Tylenol ), Serum 10 10 - 30 ug/mL    Comment: (NOTE) Therapeutic concentrations vary significantly. A range of 10-30 ug/mL  may be an effective concentration for many patients. However, some  are best  treated at concentrations outside of this range. Acetaminophen  concentrations >150 ug/mL at 4 hours after ingestion  and >50 ug/mL at 12 hours after ingestion are often associated with  toxic reactions.  Performed at [facility], [address] [place], [place] [address]   Salicylate level     Status: Abnormal   Collection Time: 07/27/23  6:33 PM  Result Value Ref Range   Salicylate Lvl <7.0 (L) 7.0 - 30.0 mg/dL    Comment: Performed at [facility], [address], [place], [place] [address]  Ethanol     Status: None   Collection Time: 07/27/23  6:33 PM  Result Value Ref Range   Alcohol, Ethyl (B) <10 <10 mg/dL    Comment: (NOTE) Lowest detectable limit for serum alcohol is 10 mg/dL.  For medical purposes only. Performed at [facility], [address] [place], [place] [address]     Blood Alcohol level:  Lab Results  Component Value Date   [facility] <10 07/27/2023   ETH <10 12/15/2022    Metabolic Disorder Labs:  Lab Results  Component Value Date   HGBA1C 5.6 12/17/2022   MPG 114.02 12/17/2022   No results found for: PROLACTIN Lab Results  Component Value Date   CHOL 164 12/18/2022   TRIG 101 12/18/2022   HDL 40 (L) 12/18/2022   CHOLHDL 4.1 12/18/2022   VLDL 20 12/18/2022   LDLCALC 104 (H) 12/18/2022   LDLCALC 89 12/17/2022    Current Medications: Current Facility-Administered Medications  Medication Dose Route Frequency Provider Last Rate Last Admin   alum & mag hydroxide-simeth (MAALOX/MYLANTA) 200-200-20 MG/5ML suspension 30 mL  30 mL Oral Q4H PRN Mardy Legacy, NP       amLODipine  (NORVASC ) tablet 5 mg  5 mg Oral Daily Nicholaus Brad RAMAN, NP   5 mg at 07/29/23 1010   haloperidol  (HALDOL ) tablet 5 mg  5 mg Oral TID PRN Mardy Legacy, NP       And   diphenhydrAMINE  (BENADRYL ) capsule 50 mg  50 mg Oral TID PRN Mardy Legacy, NP       haloperidol  lactate (HALDOL ) injection 5 mg  5 mg Intramuscular TID PRN Mardy Legacy, NP       And    diphenhydrAMINE  (BENADRYL ) injection 50 mg  50 mg Intramuscular TID PRN Mardy Legacy, NP       And   LORazepam  (ATIVAN ) injection 2 mg  2 mg Intramuscular TID PRN Mardy Legacy, NP       haloperidol  lactate (HALDOL ) injection 10 mg  10 mg Intramuscular TID PRN Mardy Legacy, NP       And   diphenhydrAMINE  (BENADRYL ) injection 50 mg  50 mg Intramuscular TID PRN Mardy Legacy, NP       And   LORazepam  (ATIVAN ) injection 2 mg  2 mg Intramuscular TID PRN Mardy Legacy, NP       [START ON 07/30/2023] DULoxetine  (CYMBALTA ) DR capsule 30 mg  30 mg Oral Daily Nicholaus Brad RAMAN, NP       feeding supplement (ENSURE ENLIVE / ENSURE PLUS) liquid 237 mL  237 mL Oral BID BM Nicholaus Brad RAMAN, NP   237 mL at 07/29/23 1014   [START ON 07/30/2023] gabapentin  (NEURONTIN ) capsule 200 mg  200 mg Oral q morning Nicholaus Brad RAMAN, NP       gabapentin  (NEURONTIN ) capsule 600 mg  600 mg Oral  QHS Ife Vitelli S, NP       hydrOXYzine  (ATARAX ) tablet 25 mg  25 mg Oral TID PRN Mardy Legacy, NP       ibuprofen  (ADVIL ) tablet 600 mg  600 mg Oral Q8H PRN Nicholaus Brad RAMAN, NP       lidocaine  (XYLOCAINE ) 2 % viscous mouth solution 15 mL  15 mL Mouth/Throat Q6H PRN Nicholaus Brad RAMAN, NP       magnesium  hydroxide (MILK OF MAGNESIA) suspension 30 mL  30 mL Oral Daily PRN Mardy Legacy, NP       multivitamin with minerals tablet 1 tablet  1 tablet Oral Daily Jadapalle, Sree, MD   1 tablet at 07/29/23 1014   nicotine  (NICODERM CQ  - dosed in mg/24 hours) patch 14 mg  14 mg Transdermal Daily Stanislaw Acton S, NP       traZODone  (DESYREL ) tablet 50 mg  50 mg Oral QHS PRN Mardy Legacy, NP       PTA Medications: Medications Prior to Admission  Medication Sig Dispense Refill Last Dose/Taking   albuterol  (VENTOLIN  HFA) 108 (90 Base) MCG/ACT inhaler Inhale 1 puff into the lungs every 6 (six) hours as needed for shortness of breath or wheezing.      amLODipine  (NORVASC ) 5 MG tablet Take 1 tablet (5 mg total) by mouth daily. 30 tablet 0     FLUoxetine  (PROZAC ) 20 MG capsule Take 1 capsule (20 mg total) by mouth daily. 30 capsule 0    gabapentin  (NEURONTIN ) 300 MG capsule Take 600 mg by mouth at bedtime.      lidocaine  (LIDODERM ) 5 % Place 1 patch onto the skin daily. Remove & Discard patch within 12 hours or as directed by MD 30 patch 0    predniSONE  (STERAPRED UNI-PAK 21 TAB) 10 MG (21) TBPK tablet Take by mouth daily. Take 6 tabs by mouth daily  for 1 days, then 5 tabs for 1 days, then 4 tabs for 1 days, then 3 tabs for 1 days, 2 tabs for 1 days, then 1 tab by mouth daily for 1 days 21 tablet 0    promethazine-dextromethorphan (PROMETHAZINE-DM) 6.25-15 MG/5ML syrup Take 5 mLs by mouth 4 (four) times daily as needed for cough.      sildenafil (VIAGRA) 100 MG tablet Take 100 mg by mouth as needed for erectile dysfunction.      tamsulosin  (FLOMAX ) 0.4 MG CAPS capsule Take 1 capsule by mouth daily.      traZODone  (DESYREL ) 50 MG tablet Take 1 tablet (50 mg total) by mouth at bedtime as needed for sleep. 30 tablet 0     Musculoskeletal: Strength & Muscle Tone: within normal limits Gait & Station: normal Patient leans: N/A            Psychiatric Specialty Exam:  Presentation  General Appearance:  Fairly Groomed; Appropriate for Environment (He is dressed in casual attire though his overall appearance suggests neglect secondary to chronic pain)  Eye Contact: Good  Speech: Slow; Clear and Coherent (withdrawn, tearful, and visibly distressed)  Speech Volume: Decreased  Handedness: Right   Mood and Affect  Mood: Depressed; Hopeless (and isolation)  Affect: Congruent; Depressed; Labile   Thought Process  Thought Processes: Coherent; Goal Directed (are organized, though dominated by themes of hopelessness, chronic pain, and suicidal ideation.)  Duration of Psychotic Symptoms: Depressive Symptoms 30 days Past Diagnosis of Schizophrenia or Psychoactive disorder: No  Descriptions of  Associations:Intact  Orientation:Full (Time, Place and Person)  Thought Content:Logical; WDL  Hallucinations:Hallucinations: None  Ideas of Reference:None  Suicidal Thoughts:Suicidal Thoughts: Yes, Passive SI Passive Intent and/or Plan: Without Intent; Without Plan; Without Means to Carry Out  Homicidal Thoughts:Homicidal Thoughts: No   Sensorium  Memory: Immediate Good; Recent Good; Remote Fair  Judgment: Poor (the patient is unable to contract for safety and states that without stable housing he may kill himself.)  Insight: Poor (due to his suicidal ideation and recent self-harming behavior.)   Executive Functions  Concentration: Fair  Attention Span: Fair  Recall: Fair  Fund of Knowledge: Good  Language: Good   Psychomotor Activity  Psychomotor Activity: Psychomotor Activity: Normal   Assets  Assets: Communication Skills; Desire for Improvement   Sleep  Sleep: Sleep: Fair Number of Hours of Sleep: 6    Physical Exam: Physical Exam Vitals and nursing note reviewed.  Constitutional:      Appearance: Normal appearance.  HENT:     Head: Normocephalic and atraumatic.     Nose: Nose normal.     Mouth/Throat:     Mouth: Mucous membranes are dry.     Pharynx: Posterior oropharyngeal erythema present.  Pulmonary:     Effort: Pulmonary effort is normal.  Musculoskeletal:        General: Normal range of motion.     Cervical back: Normal range of motion.  Neurological:     General: No focal deficit present.     Mental Status: He is alert and oriented to person, place, and time. Mental status is at baseline.  Psychiatric:        Attention and Perception: Attention and perception normal.        Mood and Affect: Mood is depressed. Affect is tearful.        Speech: Speech normal.        Behavior: Behavior is withdrawn. Behavior is cooperative.        Thought Content: Thought content includes suicidal ideation.        Cognition and Memory:  Cognition normal.        Judgment: Judgment is impulsive.    Review of Systems  Psychiatric/Behavioral:  Positive for depression, substance abuse and suicidal ideas. The patient is nervous/anxious.   All other systems reviewed and are negative.  Blood pressure (!) 144/94, pulse 80, temperature 99.2 F (37.3 C), temperature source Oral, resp. rate 18, height 5' 11 (1.803 m), weight 73.7 kg, SpO2 97%. Body mass index is 22.66 kg/m.  Treatment Plan Summary: Daily contact with patient to assess and evaluate symptoms and progress in treatment and Medication management Duloxetine  (Cymbalta ) 40 mg for neuropathic pain and musculoskeletal pain. Gabapentin  600 mg nightly adjunctive therapy for neuropathic pain Initiate  with LCSW intensive individual psychotherapy with a focus on depression, suicidal ideation, substance abuse, and adjustment to homelessness Coordinate with social work to assist in obtaining stable housing and connecting him with community resources. 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) Long Term Goal(s): Improvement in symptoms so as ready for discharge  Short Term Goals: Ability to identify changes in lifestyle to reduce recurrence of condition will improve, Ability to verbalize feelings will improve, Ability to disclose and discuss suicidal ideas, Ability to demonstrate self-control will improve, Ability to identify and develop effective coping  behaviors will improve, Ability to maintain clinical measurements within normal limits will improve, Compliance with prescribed medications will improve, and Ability to identify triggers associated with substance abuse/mental health issues will  improve  Physician Treatment Plan for Secondary Diagnosis: Principal Problem:   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) Active Problems:   Major depressive disorder, single episode, severe (HCC)   Chronic pain syndrome  Long Term Goal(s): Improvement in symptoms so as ready for discharge  Short Term Goals: Ability to identify changes in lifestyle to reduce recurrence of condition will improve, Ability to verbalize feelings will improve, Ability to disclose and discuss suicidal ideas, Ability to demonstrate self-control will improve, Ability to identify and develop effective coping behaviors will improve, Ability to maintain clinical measurements within normal limits will improve, Compliance with prescribed medications will improve, and Ability to identify triggers associated with substance abuse/mental health issues will improve  I certify that inpatient services furnished can reasonably be expected to improve the patient's condition.    Brad GORMAN Moats, NP 2/4/20254:08 PM
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
## 2023-07-29 NOTE — Progress Notes (Signed)
Patient has verbalized to several staff that he has a plan to kill himself. He feels hopeless.

## 2023-07-29 NOTE — [facility] Suicide Risk Assessment (Signed)
 [facility] INPATIENT:  Family/Significant Other Suicide Prevention Education  Suicide Prevention Education:  Patient Refusal for Family/Significant Other Suicide Prevention Education: The patient Brian Daugherty has refused to provide written consent for family/significant other to be provided Family/Significant Other Suicide Prevention Education during admission and/or prior to discharge.  Physician notified.  SPE completed with pt, as pt refused to consent to family contact. SPI pamphlet provided to pt and pt was encouraged to share information with support network, ask questions, and talk about any concerns relating to SPE. Pt denies access to guns/firearms and verbalized understanding of information provided. Mobile Crisis information also provided to pt.  Nadara JONELLE Fam 07/29/2023, 1:03 PM

## 2023-07-29 NOTE — Progress Notes (Signed)
   07/29/23 0000  Psych Admission Type (Psych Patients Only)  Admission Status Voluntary  Psychosocial Assessment  Patient Complaints Crying spells  Eye Contact Brief  Facial Expression Blank  Affect Sad  Speech Soft  Interaction Isolative  Motor Activity Restless  Appearance/Hygiene In scrubs  Behavior Characteristics Cooperative;Restless  Aggressive Behavior  Targets Self  Type of Behavior Verbal  Effect No apparent injury  Thought Process  Coherency WDL  Content WDL  Delusions WDL  Perception WDL  Hallucination None reported or observed  Judgment WDL  Confusion WDL  Danger to Self  Current suicidal ideation? Active  Description of Suicide Plan  (Did not verbalize plan)  Self-Injurious Behavior Some self-injurious ideation observed or expressed.  No lethal plan expressed   Agreement Not to Harm Self Yes  Description of Agreement Verbal  Danger to Others  Danger to Others None reported or observed

## 2023-07-29 NOTE — [facility] Suicide Risk Assessment (Signed)
 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 He was referred to [facility] for evaluation of his right leg pain, which he noted woke him up this morning with more than his usual pain. An MRI recently demonstrated nerve impingement on his right side.He denies any recent falls, new injuries, numbness, weakness, bowel or bladder incontinence, or saddle paresthesia. He also denies fever, abdominal pain, or symptoms suggestive of DVT/PE. He has no history of IV drug use, and there is no recent history of surgery, immobilization, or malignancy.In addition to his pain, the patient reports increasing depression and suicidal ideation over the past few weeks. He expresses fear of being discharged home, stating that without stable housing he might overdose on medications. He elaborates, "If I go home and don't have a place to stay, I've made my mind up" about ending his life. He identifies several stressors contributing to his mood: chronic pain, decreased mobility keeping him out of work, homelessness, recent loss of his mother last year, and a history of cocaine abuse (he reports using cocaine almost daily in recent years, smoking crack/cocaine weekly, and endorsing occasional marijuana use).  He denies any alcohol use in the past 24 hours.The patient mentions that over the past couple of months his living situation deteriorated--he was evicted from a crowded household and is now homeless for several days, which has compounded his feelings of isolation and hopelessness. He recalls a previous suicide attempt (an overdose) about 10 years ago. Although he previously reported auditory and visual hallucinations (voices telling him to harm himself), during this evaluation he clarifies that these "voices" are more like intrusive thoughts and denies any current hallucinations.The patient complains of dry oral mucosa and associated mouth pain.He states, "It just seems like one thing sets me back so far," and describes feeling isolated, with no stable housing or supportive network. He denies homicidal ideation.  Continued Clinical Symptoms:  Alcohol Use Disorder Identification Test Final Score (AUDIT): 0 The Alcohol Use Disorders Identification Test, Guidelines for Use in Primary Care, Second Edition.  World Science Writer [facility]). Score between 0-7:  no or low risk or alcohol related problems. Score between 8-15:  moderate risk of alcohol related problems. Score between 16-19:  high risk of alcohol related problems. Score 20 or above:  warrants further diagnostic evaluation for alcohol dependence and treatment.   CLINICAL FACTORS:   Depression:   Anhedonia Comorbid alcohol abuse/dependence Impulsivity Insomnia Alcohol/Substance Abuse/Dependencies Chronic Pain Medical Diagnoses and Treatments/Surgeries   Musculoskeletal: Strength & Muscle Tone: within normal limits Gait & Station: normal Patient leans: N/A  Psychiatric Specialty Exam:  Presentation  General Appearance:  Fairly Groomed; Appropriate for Environment (He is dressed in casual attire though his overall appearance suggests neglect secondary to chronic pain)  Eye Contact: Good  Speech: Slow; Clear and Coherent  (withdrawn, tearful, and visibly distressed)  Speech Volume: Decreased  Handedness: Right   Mood and Affect  Mood: Depressed; Hopeless (and isolation)  Affect: Congruent; Depressed; Labile   Thought Process  Thought Processes: Coherent; Goal Directed (are organized, though dominated by themes of hopelessness, chronic pain, and suicidal ideation.)  Descriptions of Associations:Intact  Orientation:Full (Time, Place and Person)  Thought Content:Logical; WDL  History of Schizophrenia/Schizoaffective disorder:No  Duration of Psychotic Symptoms:N/A  Hallucinations:Hallucinations: None  Ideas of Reference:None  Suicidal Thoughts:Suicidal Thoughts: Yes, Passive SI Passive Intent and/or Plan: Without Intent; Without Plan; Without Means to Carry Out  Homicidal Thoughts:Homicidal Thoughts: No   Sensorium  Memory: Immediate Good; Recent Good; Remote Fair  Judgment: Poor (the patient is unable to contract for safety and states that without stable housing he may kill himself.)  Insight: Poor (due to his suicidal ideation and recent self-harming behavior.)   Executive Functions  Concentration: Fair  Attention Span: Fair  Recall: Fair  Fund of Knowledge: Good  Language: Good   Psychomotor Activity  Psychomotor Activity:Psychomotor Activity: Normal   Assets  Assets: Communication Skills; Desire for Improvement   Sleep  Sleep:Sleep: Fair Number of Hours of Sleep: 6    Physical Exam: Physical Exam ROS Blood pressure (!) 144/94, pulse 80, temperature 99.2 F (37.3 C), temperature source Oral, resp. rate 18, height 5' 11 (1.803 m), weight 73.7 kg, SpO2 97%. Body mass index is 22.66 kg/m.   COGNITIVE FEATURES THAT CONTRIBUTE TO RISK:  None    SUICIDE RISK:   Mild:  Suicidal ideation of limited frequency, intensity, duration, and specificity.  There are no identifiable plans, no associated intent, mild dysphoria and related symptoms, good  self-control (both objective and subjective assessment), few other risk factors, and identifiable protective factors, including available and accessible social support.  PLAN OF CARE:  Duloxetine  (Cymbalta ) 40 mg for neuropathic pain and musculoskeletal pain. Gabapentin  600 mg nightly adjunctive therapy for neuropathic pain Initiate  with LCSW intensive individual psychotherapy with a focus on depression, suicidal ideation, substance abuse, and adjustment to homelessness Coordinate with social work to assist in obtaining stable housing and connecting him with community resources.  I certify that inpatient services furnished can reasonably be expected to improve the patient's condition.   Brad GORMAN Moats, NP 07/29/2023, 3:29 PM

## 2023-07-29 NOTE — Group Note (Signed)
 LCSW Group Therapy Note   Group Date: 07/29/2023 Start Time: 1300 End Time: 1400   Type of Therapy and Topic:  Group Therapy: Challenging Core Beliefs  Participation Level:  Did Not Attend  Description of Group:  Patients were educated about core beliefs and asked to identify one harmful core belief that they have. Patients were asked to explore from where those beliefs originate. Patients were asked to discuss how those beliefs make them feel and the resulting behaviors of those beliefs. They were then be asked if those beliefs are true and, if so, what evidence they have to support them. Lastly, group members were challenged to replace those negative core beliefs with helpful beliefs.   Therapeutic Goals:   1. Patient will identify harmful core beliefs and explore the origins of such beliefs. 2. Patient will identify feelings and behaviors that result from those core beliefs. 3. Patient will discuss whether such beliefs are true. 4.  Patient will replace harmful core beliefs with helpful ones.  Summary of Patient Progress:  Patient did not attend.   Therapeutic Modalities: Cognitive Behavioral Therapy; Solution-Focused Therapy   Brian Daugherty, LCSWA 07/29/2023  1:56 PM

## 2023-07-29 NOTE — Group Note (Signed)

## 2023-07-29 NOTE — Group Note (Signed)
 Date:  07/29/2023 Time:  8:48 PM  Group Topic/Focus:  Wrap-Up Group:   The focus of this group is to help patients review their daily goal of treatment and discuss progress on daily workbooks.    Participation Level:  Did Not Attend  Participation Quality:   none  Affect:   none  Cognitive:   none  Insight: None  Engagement in Group:   none  Modes of Intervention:   none  Additional Comments:  none   Jaishon Krisher 07/29/2023, 8:48 PM

## 2023-07-29 NOTE — Plan of Care (Signed)
  Problem: Education: Goal: Knowledge of [place] [facility] Education information/materials will improve Outcome: Progressing Goal: Emotional status will improve Outcome: Progressing Goal: Mental status will improve Outcome: Progressing Goal: Verbalization of understanding the information provided will improve Outcome: Progressing   Problem: Activity: Goal: Interest or engagement in activities will improve Outcome: Progressing Goal: Sleeping patterns will improve Outcome: Not Met (add Reason)   Problem: Coping: Goal: Ability to verbalize frustrations and anger appropriately will improve Outcome: Progressing

## 2023-07-29 NOTE — Progress Notes (Addendum)
 Assumed care of patient this am, he present A&O x 4 , affect  & mood sad, depressed and continue to have passive suicidal ideations but able to make a safety commitment. Pt out in the milieu, minimal  socialization with others, declined group attendance and focused on finding housing/treatment center for after discharge. Pt behavior to situation and was without complaints.  Pt is compliant with taking medications  Pt educated on plan of care, medication regimen and he acknowledged an understanding and was without further complaints or concerns. Pt is managed on q 15 min rounds.

## 2023-07-29 NOTE — Group Note (Signed)
Date:  07/29/2023 Time:  6:19 PM  Group Topic/Focus:  OUTDOOR RECREATION THERAPY    Participation Level:  Did Not Attend   Rosaura Carpenter 07/29/2023, 6:19 PM

## 2023-07-29 NOTE — [facility] Counselor (Signed)
 [facility] information requesting required admission documents/needs as well as [place] vacancies of surrounding areas provided to patient by CSW team.   Patient encouraged to utilize the resources to assess for placement. Patient understood.   CSW team to continue to assess.     Roshawn Ayala, MSW, LCSWA 07/29/2023 10:22 AM

## 2023-07-30 DIAGNOSIS — F1494 Cocaine use, unspecified with cocaine-induced mood disorder: Secondary | ICD-10-CM | POA: Diagnosis not present

## 2023-07-30 MED ORDER — DULOXETINE HCL 20 MG PO CPEP
40.0000 mg | ORAL_CAPSULE | Freq: Every day | ORAL | Status: DC
  Administered 2023-07-31 – 2023-08-05 (×6): 40 mg via ORAL
  Filled 2023-07-30 (×6): qty 2
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
## 2023-07-30 NOTE — Group Note (Signed)
 Date:  07/30/2023 Time:  11:06 AM  Group Topic/Focus:  Dimensions of Wellness:   The focus of this group is to introduce the topic of wellness and discuss the role each dimension of wellness plays in total health. Goals Group:   The focus of this group is to help patients establish daily goals to achieve during treatment and discuss how the patient can incorporate goal setting into their daily lives to aide in recovery.    Participation Level:  Did Not Attend   Camia Dipinto 07/30/2023, 11:06 AM

## 2023-07-30 NOTE — Group Note (Signed)
 [facility] LCSW Group Therapy Note   Group Date: 07/30/2023 Start Time: 1300 End Time: 1345   Type of Therapy/Topic:  Group Therapy:  Emotion Regulation  Participation Level:  Did Not Attend    Description of Group:    The purpose of this group is to assist patients in learning to regulate negative emotions and experience positive emotions. Patients will be guided to discuss ways in which they have been vulnerable to their negative emotions. These vulnerabilities will be juxtaposed with experiences of positive emotions or situations, and patients challenged to use positive emotions to combat negative ones. Special emphasis will be placed on coping with negative emotions in conflict situations, and patients will process healthy conflict resolution skills.  Therapeutic Goals: Patient will identify two positive emotions or experiences to reflect on in order to balance out negative emotions:  Patient will label two or more emotions that they find the most difficult to experience:  Patient will be able to demonstrate positive conflict resolution skills through discussion or role plays:   Summary of Patient Progress: X   Therapeutic Modalities:   Cognitive Behavioral Therapy Feelings Identification Dialectical Behavioral Therapy   Glenis Smoker, LCSW

## 2023-07-30 NOTE — Progress Notes (Signed)
 Pt isolative to his room tonight. Pt did come up and get his scheduled night time medication. Pt endorses passive SI, verbally contracts for safety. Pt compliant with medication administration per MD orders. Pt given education, support, and encouragement to be active in his treatment plan. Pt being monitored Q 15 minutes for safety per unit protocol, remains safe on the unit

## 2023-07-30 NOTE — Plan of Care (Signed)
   Problem: Education: Goal: Emotional status will improve Outcome: Progressing Goal: Mental status will improve Outcome: Progressing Goal: Verbalization of understanding the information provided will improve Outcome: Progressing
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
## 2023-07-30 NOTE — [facility] Counselor (Signed)
 CSW touched base with [employer]. Patient's application is still under review.   CSW to continue to assess.    Jocelin Schuelke, MSW, LCSWA 07/30/2023 4:12 PM

## 2023-07-30 NOTE — B[facility] Counselor (Signed)
[facility]. Patient coducted phone screening.   CSW to continue to assess.    Reymundo Poll, MSW, LCSWA 07/30/2023 1:27 PM

## 2023-07-30 NOTE — Progress Notes (Signed)
   07/30/23 0935  Psych Admission Type (Psych Patients Only)  Admission Status Voluntary  Psychosocial Assessment  Patient Complaints Anxiety;Hopelessness;Depression  Eye Contact Brief  Facial Expression Sad  Affect Depressed;Sad  Speech Soft  Interaction Minimal  Motor Activity Slow  Appearance/Hygiene In scrubs  Behavior Characteristics Cooperative  Mood Depressed  Thought Process  Coherency WDL  Content WDL  Delusions None reported or observed  Perception WDL  Hallucination None reported or observed  Judgment Impaired  Confusion None  Danger to Self  Current suicidal ideation? Passive  Agreement Not to Harm Self Yes  Description of Agreement Verbal  Danger to Others  Danger to Others None reported or observed

## 2023-07-30 NOTE — BH IP Treatment Plan (Signed)
 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) Active Problems:   Major depressive disorder, single episode, severe (HCC)   Chronic pain syndrome   Current Medications:  Current Facility-Administered Medications  Medication Dose Route Frequency Provider Last Rate Last Admin   alum & mag hydroxide-simeth (MAALOX/MYLANTA) 200-200-20 MG/5ML suspension 30 mL  30 mL Oral Q4H PRN Mardy Legacy, NP       amLODipine  (NORVASC ) tablet 5 mg  5 mg Oral Daily Nicholaus Brad RAMAN, NP   5 mg at 07/30/23 9064   haloperidol  (HALDOL ) tablet 5 mg  5 mg Oral TID PRN Mardy Legacy, NP       And   diphenhydrAMINE  (BENADRYL ) capsule 50 mg  50 mg Oral TID PRN Mardy Legacy, NP       haloperidol  lactate (HALDOL ) injection 5 mg  5 mg Intramuscular TID PRN Mardy Legacy, NP       And   diphenhydrAMINE  (BENADRYL ) injection 50 mg  50 mg Intramuscular TID PRN Mardy Legacy, NP       And   LORazepam  (ATIVAN ) injection 2 mg  2 mg Intramuscular TID PRN Mardy Legacy, NP       haloperidol  lactate (HALDOL ) injection 10 mg  10 mg Intramuscular TID PRN Mardy Legacy, NP       And   diphenhydrAMINE  (BENADRYL ) injection 50 mg  50 mg Intramuscular TID PRN Mardy Legacy, NP       And   LORazepam  (ATIVAN ) injection 2 mg  2 mg Intramuscular TID PRN Mardy Legacy, NP       DULoxetine  (CYMBALTA ) DR capsule 30 mg  30 mg Oral Daily Nicholaus Brad RAMAN, NP   30 mg at 07/30/23 0935   feeding supplement (ENSURE ENLIVE / ENSURE PLUS) liquid 237 mL  237 mL Oral BID BM Nicholaus Brad RAMAN, NP   237 mL at 07/29/23 1014   gabapentin  (NEURONTIN ) capsule 200 mg  200 mg Oral q morning Nicholaus Brad RAMAN, NP   200 mg at 07/30/23 9064   gabapentin  (NEURONTIN ) capsule 600 mg  600 mg  Oral QHS Davis, Nina S, NP   600 mg at 07/29/23 2208   hydrOXYzine  (ATARAX ) tablet 25 mg  25 mg Oral TID PRN Mardy Legacy, NP       ibuprofen  (ADVIL ) tablet 600 mg  600 mg Oral Q8H PRN Nicholaus Brad RAMAN, NP   600 mg at 07/30/23 0645   lidocaine  (XYLOCAINE ) 2 % viscous mouth solution 15 mL  15 mL Mouth/Throat Q6H PRN Nicholaus Brad RAMAN, NP   15 mL at 07/30/23 9063   magnesium  hydroxide (MILK OF MAGNESIA) suspension 30 mL  30 mL Oral Daily PRN Mardy Legacy, NP       melatonin tablet 5 mg  5 mg Oral QHS Nicholaus Brad RAMAN, NP   5 mg at 07/29/23 2208   multivitamin with minerals tablet 1 tablet  1 tablet Oral Daily Jadapalle, Sree, MD   1 tablet at 07/30/23 0935   nicotine  (NICODERM CQ  - dosed in mg/24 hours) patch 14 mg  14 mg Transdermal Daily Davis, Nina S, NP       traZODone  (DESYREL ) tablet 50 mg  50 mg Oral QHS PRN Mardy Legacy, NP   50 mg at 07/29/23 2208   PTA Medications: Medications Prior to Admission  Medication Sig  Dispense Refill Last Dose/Taking   albuterol  (VENTOLIN  HFA) 108 (90 Base) MCG/ACT inhaler Inhale 1 puff into the lungs every 6 (six) hours as needed for shortness of breath or wheezing.      amLODipine  (NORVASC ) 5 MG tablet Take 1 tablet (5 mg total) by mouth daily. 30 tablet 0    FLUoxetine  (PROZAC ) 20 MG capsule Take 1 capsule (20 mg total) by mouth daily. 30 capsule 0    gabapentin  (NEURONTIN ) 300 MG capsule Take 600 mg by mouth at bedtime.      lidocaine  (LIDODERM ) 5 % Place 1 patch onto the skin daily. Remove & Discard patch within 12 hours or as directed by MD 30 patch 0    predniSONE  (STERAPRED UNI-PAK 21 TAB) 10 MG (21) TBPK tablet Take by mouth daily. Take 6 tabs by mouth daily  for 1 days, then 5 tabs for 1 days, then 4 tabs for 1 days, then 3 tabs for 1 days, 2 tabs for 1 days, then 1 tab by mouth daily for 1 days 21 tablet 0    promethazine-dextromethorphan (PROMETHAZINE-DM) 6.25-15 MG/5ML syrup Take 5 mLs by mouth 4 (four) times daily as needed for cough.       sildenafil (VIAGRA) 100 MG tablet Take 100 mg by mouth as needed for erectile dysfunction.      tamsulosin  (FLOMAX ) 0.4 MG CAPS capsule Take 1 capsule by mouth daily.      traZODone  (DESYREL ) 50 MG tablet Take 1 tablet (50 mg total) by mouth at bedtime as needed for sleep. 30 tablet 0     Patient Stressors: Financial difficulties   Traumatic event    Patient Strengths: Average or above average intelligence  Capable of independent living   Treatment Modalities: Medication Management, Group therapy, Case management,  1 to 1 session with clinician, Psychoeducation, Recreational therapy.   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) Long Term Goal(s): Improvement in symptoms so as ready for discharge   Short Term Goals: Ability to identify changes in lifestyle to reduce recurrence of condition will improve Ability to verbalize feelings will improve Ability to disclose and discuss suicidal ideas Ability to demonstrate self-control will improve Ability to identify and develop effective coping behaviors will improve Ability to maintain clinical measurements within normal limits will improve Compliance with prescribed medications will improve Ability to identify triggers associated with substance abuse/mental health issues will improve  Medication Management: Evaluate patient's response, side effects, and tolerance of medication regimen.  Therapeutic Interventions: 1 to 1 sessions, Unit Group sessions and Medication administration.  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) Active Problems:   Major depressive disorder, single episode, severe (HCC)   Chronic pain syndrome  Long Term Goal(s): Improvement in symptoms so as ready for discharge   Short Term Goals: Ability to identify changes in lifestyle  to reduce recurrence of condition will improve Ability to verbalize feelings will improve Ability to disclose and discuss suicidal ideas Ability to demonstrate self-control will improve Ability to identify and develop effective coping behaviors will improve Ability to maintain clinical measurements within normal limits will improve Compliance with prescribed medications will improve Ability to identify triggers associated with substance abuse/mental health issues will improve     Medication Management: Evaluate patient's response, side effects, and tolerance of medication regimen.  Therapeutic Interventions: 1 to 1 sessions, Unit Group sessions and Medication administration.  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) Long Term Goal(s): Knowledge of disease and therapeutic regimen to maintain health will improve  Short Term Goals: Ability to remain free from injury will improve, Ability to verbalize frustration and anger appropriately will improve, Ability to demonstrate self-control, Ability to participate in decision making will improve, Ability to verbalize feelings will improve, Ability to disclose and discuss suicidal ideas, Ability to identify and develop effective coping behaviors will improve, and Compliance with prescribed medications will improve  Medication Management: RN will administer medications as ordered by provider, will assess and evaluate patient's response and provide education to patient for prescribed medication. RN will report any adverse and/or side effects to prescribing provider.  Therapeutic Interventions: 1 on 1 counseling sessions, Psychoeducation, Medication administration, Evaluate responses to treatment, Monitor vital signs and CBGs as ordered, Perform/monitor CIWA, COWS, AIMS and Fall Risk screenings as ordered, Perform wound care treatments as ordered.  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  Short Term Goals: Engage patient in aftercare planning with referrals and resources, Increase social support, Increase ability to appropriately verbalize feelings, Increase emotional regulation, Facilitate acceptance of mental health diagnosis and concerns, Facilitate patient progression through stages of change regarding substance use diagnoses and concerns, Identify triggers associated with mental health/substance abuse issues, and Increase skills for wellness and recovery  Therapeutic Interventions: Assess for all discharge needs, 1 to 1 time with Social worker, Explore available resources and support systems, Assess for adequacy in community support network, Educate family and significant other(s) on suicide prevention, Complete Psychosocial Assessment, Interpersonal group therapy.  Evaluation of Outcomes: Not Met   Progress in Treatment: Attending groups: No. Participating in groups: No. Taking medication as prescribed: Yes. Toleration medication: Yes. Family/Significant other contact made: No, will contact:  if given permission. Patient understands diagnosis: Yes. Discussing patient identified problems/goals with staff: Yes. Medical problems stabilized or resolved: Yes. Denies suicidal/homicidal ideation: No. Issues/concerns per patient self-inventory: No. Other: none.  New problem(s) identified: No, Describe:  none identified.  New Short Term/Long Term Goal(s): medication management for mood stabilization; elimination of SI thoughts; development of comprehensive mental wellness/sobriety plan.  Patient Goals:  Like to have help in finding a stable place to stay.  Discharge Plan or Barriers: CSW will  assist pt with development of an appropriate aftercare/discharge plan.   Reason for Continuation of Hospitalization: Anxiety Depression Medication stabilization Suicidal ideation  Estimated Length of Stay: 1-7 days  Last 3 [place] Suicide Severity Risk Score: Flowsheet Row Admission (Current) from 07/28/2023 in [facility] INPATIENT BE[facility]INE ED from 07/27/2023 in [facility] Emergency Department [facility]  C-SSRS RISK CATEGORY Low Risk No Risk High Risk       Last PHQ 2/9 Scores:     No data to display          Scribe for Treatment Team: Nadara JONELLE Fam, LCSW 07/30/2023 10:03 AM

## 2023-07-30 NOTE — Group Note (Signed)
 Date:  07/30/2023 Time:  9:16 PM  Group Topic/Focus:  Wrap-Up Group:   The focus of this group is to help patients review their daily goal of treatment and discuss progress on daily workbooks.    Participation Level:  Did Not Attend  Participation Quality:   none  Affect:   none  Cognitive:   none  Insight: None  Engagement in Group:   none  Modes of Intervention:   none  Additional Comments:  none  Kerri Katz 07/30/2023, 9:16 PM

## 2023-07-30 NOTE — Progress Notes (Addendum)
 [facility] MD Progress Note  07/30/2023 7:58 PM Shanan Kettles  MRN:  [id number] Subjective:  58 year old African American male who attended the Interdisciplinary Team (IDT) meeting expressing a strong desire for recovery, stating, I want to get better. I have never been in this situation; I just want another chance.he patient presents with symptoms consistent with a depressive disorder, including sad affect, soft speech, minimal interaction, and slow motor activity. His judgment appears impaired, and he reports passive suicidal ideation, though he has verbally agreed not to harm himself. Despite these challenges, he demonstrates insight into his condition and a strong motivation for recovery, as evidenced by his statements during the IDT meeting. 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 No pertinent family history. Family Psychiatric  History: none reported Social History:  Social History   Substance and Sexual Activity  Alcohol Use No     Social History   Substance and Sexual Activity  Drug Use Yes   Types: Cocaine, Marijuana    Social History   Socioeconomic History   Marital status: Single    Spouse name: Not on file   Number of children: Not on file   Years of education: Not on file   Highest education level: Not on file  Occupational History   Not on file  Tobacco Use   Smoking status: Every Day    Current packs/day: 0.50    Types: Cigarettes    Smokeless tobacco: Never  Substance and Sexual Activity   Alcohol use: No   Drug use: Yes    Types: Cocaine, Marijuana   Sexual activity: Not Currently  Other Topics Concern   Not on file  Social History Narrative   Not on file   Social Drivers of Health   Financial Resource Strain: Not on file  Food Insecurity: Food Insecurity Present (07/28/2023)   Hunger Vital Sign    Worried About Running Out of Food in the Last Year: Often true    Ran Out of Food in the Last Year: Often true  Transportation Needs: Unmet Transportation Needs (07/28/2023)   PRAPARE - Administrator, Civil Service (Medical): Yes    Lack of Transportation (Non-Medical): Yes  Physical Activity: Not on file  Stress: Not on file  Social Connections: Unknown (11/03/2021)   Received from [facility]   Social Network    Social Network: Not on file   Additional Social History:                         Sleep: Good  Appetite:  Good  Current Medications: Current Facility-Administered Medications  Medication Dose Route Frequency Provider Last Rate Last Admin   alum & mag hydroxide-simeth (MAALOX/MYLANTA) 200-200-20 MG/5ML suspension 30 mL  30 mL Oral Q4H PRN Mardy Legacy, NP       amLODipine  (NORVASC ) tablet 5 mg  5 mg Oral Daily Nicholaus Brad RAMAN, NP   5 mg  at 07/30/23 0935   haloperidol  (HALDOL ) tablet 5 mg  5 mg Oral TID PRN Mardy Legacy, NP       And   diphenhydrAMINE  (BENADRYL ) capsule 50 mg  50 mg Oral TID PRN Mardy Legacy, NP       haloperidol  lactate (HALDOL ) injection 5 mg  5 mg Intramuscular TID PRN Mardy Legacy, NP       And   diphenhydrAMINE  (BENADRYL ) injection 50 mg  50 mg Intramuscular TID PRN Mardy Legacy, NP       And   LORazepam  (ATIVAN ) injection 2 mg  2 mg Intramuscular TID PRN Mardy Legacy, NP       haloperidol  lactate (HALDOL ) injection 10 mg  10 mg Intramuscular TID PRN Mardy Legacy, NP       And   diphenhydrAMINE  (BENADRYL )  injection 50 mg  50 mg Intramuscular TID PRN Mardy Legacy, NP       And   LORazepam  (ATIVAN ) injection 2 mg  2 mg Intramuscular TID PRN Mardy Legacy, NP       DULoxetine  (CYMBALTA ) DR capsule 30 mg  30 mg Oral Daily Nicholaus Brad RAMAN, NP   30 mg at 07/30/23 0935   feeding supplement (ENSURE ENLIVE / ENSURE PLUS) liquid 237 mL  237 mL Oral BID BM Nicholaus Brad RAMAN, NP   237 mL at 07/30/23 1125   gabapentin  (NEURONTIN ) capsule 200 mg  200 mg Oral q morning Nicholaus Brad RAMAN, NP   200 mg at 07/30/23 9064   gabapentin  (NEURONTIN ) capsule 600 mg  600 mg Oral QHS Cesilia Shinn S, NP   600 mg at 07/29/23 2208   hydrOXYzine  (ATARAX ) tablet 25 mg  25 mg Oral TID PRN Mardy Legacy, NP       ibuprofen  (ADVIL ) tablet 600 mg  600 mg Oral Q8H PRN Rhyanna Sorce S, NP   600 mg at 07/30/23 0645   lidocaine  (XYLOCAINE ) 2 % viscous mouth solution 15 mL  15 mL Mouth/Throat Q6H PRN Nicholaus Brad RAMAN, NP   15 mL at 07/30/23 9063   magnesium  hydroxide (MILK OF MAGNESIA) suspension 30 mL  30 mL Oral Daily PRN Mardy Legacy, NP       melatonin tablet 5 mg  5 mg Oral QHS Nicholaus Brad RAMAN, NP   5 mg at 07/29/23 2208   multivitamin with minerals tablet 1 tablet  1 tablet Oral Daily Jadapalle, Sree, MD   1 tablet at 07/30/23 0935   nicotine  (NICODERM CQ  - dosed in mg/24 hours) patch 14 mg  14 mg Transdermal Daily Kajuan Guyton S, NP       traZODone  (DESYREL ) tablet 50 mg  50 mg Oral QHS PRN Mardy Legacy, NP   50 mg at 07/29/23 2208    Lab Results: No results found for this or any previous visit (from the past 48 hours).  Blood Alcohol level:  Lab Results  Component Value Date   ETH <10 07/27/2023   ETH <10 12/15/2022    Metabolic Disorder Labs: Lab Results  Component Value Date   HGBA1C 5.6 12/17/2022   MPG 114.02 12/17/2022   No results found for: PROLACTIN Lab Results  Component Value Date   CHOL 164 12/18/2022   TRIG 101 12/18/2022   HDL 40 (L) 12/18/2022   CHOLHDL 4.1 12/18/2022   VLDL 20 12/18/2022    LDLCALC 104 (H) 12/18/2022   LDLCALC 89 12/17/2022    Physical Findings: AIMS:  , ,  ,  ,    CIWA:  COWS:  COWS Total Score: 0  Musculoskeletal: Strength & Muscle Tone: within normal limits Gait & Station: normal Patient leans: N/A  Psychiatric Specialty Exam:  Presentation  General Appearance:  Fairly Groomed; Appropriate for Environment (He is dressed in casual attire though his overall appearance suggests neglect secondary to chronic pain)  Eye Contact: Good  Speech: Slow; Clear and Coherent (withdrawn, tearful, and visibly distressed)  Speech Volume: Decreased  Handedness: Right   Mood and Affect  Mood: Depressed; Hopeless (and isolation)  Affect: Congruent; Depressed; Labile   Thought Process  Thought Processes: Coherent; Goal Directed (are organized, though dominated by themes of hopelessness, chronic pain, and suicidal ideation.)  Descriptions of Associations:Intact  Orientation:Full (Time, Place and Person)  Thought Content:Logical; WDL  History of Schizophrenia/Schizoaffective disorder:No  Duration of Psychotic Symptoms:N/A  Hallucinations:Hallucinations: None  Ideas of Reference:None  Suicidal Thoughts:Suicidal Thoughts: Yes, Passive SI Passive Intent and/or Plan: Without Intent; Without Plan; Without Means to Carry Out  Homicidal Thoughts:Homicidal Thoughts: No   Sensorium  Memory: Immediate Good; Recent Good; Remote Fair  Judgment: Poor (the patient is unable to contract for safety and states that without stable housing he may kill himself.)  Insight: Poor (due to his suicidal ideation and recent self-harming behavior.)   Executive Functions  Concentration: Fair  Attention Span: Fair  Recall: Fair  Fund of Knowledge: Good  Language: Good   Psychomotor Activity  Psychomotor Activity: Psychomotor Activity: Normal   Assets  Assets: Communication Skills; Desire for Improvement   Sleep  Sleep: Sleep:  Fair Number of Hours of Sleep: 6    Physical Exam: Physical Exam Vitals and nursing note reviewed.  Constitutional:      Appearance: Normal appearance.  HENT:     Head: Normocephalic and atraumatic.     Nose: Nose normal.  Pulmonary:     Effort: Pulmonary effort is normal.  Musculoskeletal:        General: Normal range of motion.     Cervical back: Normal range of motion.  Neurological:     General: No focal deficit present.     Mental Status: He is alert and oriented to person, place, and time. Mental status is at baseline.  Psychiatric:        Attention and Perception: Attention and perception normal.        Mood and Affect: Mood is anxious and depressed. Affect is tearful.        Speech: Speech normal.        Behavior: Behavior normal. Behavior is cooperative.        Thought Content: Thought content includes suicidal ideation.        Cognition and Memory: Cognition and memory normal.        Judgment: Judgment normal.    Review of Systems  Psychiatric/Behavioral:  Positive for substance abuse and suicidal ideas. The patient is nervous/anxious.   All other systems reviewed and are negative.  Blood pressure 123/88, pulse 81, temperature 98.8 F (37.1 C), resp. rate (!) 22, height 5' 11 (1.803 m), weight 73.7 kg, SpO2 96%. Body mass index is 22.66 kg/m.   Treatment Plan Summary: Daily contact with patient to assess and evaluate symptoms and progress in treatment and Medication management Duloxetine  (Cymbalta ) 40 mg for neuropathic pain and musculoskeletal pain. Gabapentin  600 mg nightly adjunctive therapy for neuropathic pain Melatonin 5 mg for sleep support Initiate  with LCSW intensive individual psychotherapy with a focus on depression, suicidal ideation, substance abuse, and adjustment to homelessness Coordinate with social  work to assist in obtaining stable housing and connecting him with community resources. Brad GORMAN Moats, NP 07/30/2023, 7:58 PM

## 2023-07-30 NOTE — Plan of Care (Signed)
  Problem: Education: Goal: Knowledge of [facility] Education information/materials will improve Outcome: Progressing Goal: Emotional status will improve Outcome: Progressing Goal: Mental status will improve Outcome: Progressing Goal: Verbalization of understanding the information provided will improve Outcome: Progressing   Problem: Activity: Goal: Interest or engagement in activities will improve Outcome: Progressing Goal: Sleeping patterns will improve Outcome: Progressing   Problem: Coping: Goal: Ability to verbalize frustrations and anger appropriately will improve Outcome: Progressing Goal: Ability to demonstrate self-control will improve Outcome: Progressing   Problem: Health Behavior/Discharge Planning: Goal: Identification of resources available to assist in meeting health care needs will improve Outcome: Progressing Goal: Compliance with treatment plan for underlying cause of condition will improve Outcome: Progressing   Problem: Physical Regulation: Goal: Ability to maintain clinical measurements within normal limits will improve Outcome: Progressing   Problem: Safety: Goal: Periods of time without injury will increase Outcome: Progressing   Problem: Education: Goal: Utilization of techniques to improve thought processes will improve Outcome: Progressing Goal: Knowledge of the prescribed therapeutic regimen will improve Outcome: Progressing   Problem: Activity: Goal: Interest or engagement in leisure activities will improve Outcome: Progressing Goal: Imbalance in normal sleep/wake cycle will improve Outcome: Progressing   Problem: Coping: Goal: Coping ability will improve Outcome: Progressing Goal: Will verbalize feelings Outcome: Progressing   Problem: Health Behavior/Discharge Planning: Goal: Ability to make decisions will improve Outcome: Progressing Goal: Compliance with therapeutic regimen will improve Outcome: Progressing    Problem: Role Relationship: Goal: Will demonstrate positive changes in social behaviors and relationships Outcome: Progressing   Problem: Safety: Goal: Ability to disclose and discuss suicidal ideas will improve Outcome: Progressing Goal: Ability to identify and utilize support systems that promote safety will improve Outcome: Progressing   Problem: Self-Concept: Goal: Will verbalize positive feelings about self Outcome: Progressing Goal: Level of anxiety will decrease Outcome: Progressing   Problem: Education: Goal: Ability to make informed decisions regarding treatment will improve Outcome: Progressing   Problem: Coping: Goal: Coping ability will improve Outcome: Progressing   Problem: Health Behavior/Discharge Planning: Goal: Identification of resources available to assist in meeting health care needs will improve Outcome: Progressing   Problem: Medication: Goal: Compliance with prescribed medication regimen will improve Outcome: Progressing   Problem: Self-Concept: Goal: Ability to disclose and discuss suicidal ideas will improve Outcome: Progressing Goal: Will verbalize positive feelings about self Outcome: Progressing Note: Patient is on track. Patient will maintain adherence

## 2023-07-30 NOTE — Group Note (Signed)
Date:  07/30/2023 Time:  6:20 PM  Group Topic/Focus:  Outdoor recreation structured activity    Participation Level:  Did Not Attend   Hollie Bartus 07/30/2023, 6:20 PM

## 2023-07-31 DIAGNOSIS — F322 Major depressive disorder, single episode, severe without psychotic features: Secondary | ICD-10-CM | POA: Diagnosis not present

## 2023-07-31 DIAGNOSIS — F1494 Cocaine use, unspecified with cocaine-induced mood disorder: Secondary | ICD-10-CM | POA: Diagnosis not present

## 2023-07-31 NOTE — Group Note (Signed)
 Date:  07/31/2023 Time:  10:21 AM  Group Topic/Focus:   Goals Group:   The focus of this group is to help patients establish daily goals to achieve during treatment and discuss how the patient can incorporate goal setting into their daily lives to aide in recovery  Overcoming Stress:   The focus of this group is to define stress and help patients assess their triggers.   Participation Level:  Did Not Attend   Brian Daugherty A [name] 07/31/2023, 10:21 AM

## 2023-07-31 NOTE — [facility] Counselor (Signed)
 CSW touched base with [facility] and patient has been APPROVED. However, patient has a child support court hearing on 08/05/23. [facility] requested documentation on patient's behalf noting that he doesn't need to appear in court on 08/05/23 be sent. [facility] is stating that if documentation isn't provided patient will not be able to start treatment until after 08/05/23 court date.   CSW touched base with patient's case manager D. Secundino at [phone]. Case worker explained that since the patient is going into treatment, he DOES NOT need to appear in court. Case manager will appear and have a continuance of the case. CSW offered case manager with needed treatment dates that range from his pending discharge tomorrow 08/01/23 until 30 days after 08/30/23. Case Manager plans to have the court date pushed back until September 30, 2023 or October 14, 2023.   As of now, it has been confirmed that patient DOES NOT need to appear In court by case manager. Case manager to send CSW requested documentation to be sent to [facility].   Case manager is requesting documentation on patient's behalf from [facility] noting the patient's admission to the program for treatment to be provided in court on 08/05/23 on his behalf. CSW touched base with [facility] to request needed documentation. [facility] to provide once patient is admitted. CSW provided [facility] with case manager's number and case production designer, theatre/television/film with [facility]'s. This has been communicated to patient and team.  CSW team to continue to assess.   Easter Schinke, MSW, LCSWA 07/31/2023 10:29 AM

## 2023-07-31 NOTE — Progress Notes (Addendum)
 [facility] MD Progress Note  07/31/2023 6:55 PM Curby Carswell  MRN:  [id number] Subjective: 58 year old African American male who reports experiencing anxiety, depression, and self-harm behaviors. He acknowledges current suicidal ideation but does not verbalize a specific plan. The patient has verbally agreed not to harm himself. He exhibits minimal interaction and reports feelings of sadness. The patient presents with symptoms indicative of major depressive disorder, characterized by a depressed mood, restlessness, soft speech, and limited judgment. The presence of suicidal ideation, albeit without a specific plan, and self-harm behaviors necessitates immediate attention. The patient's acceptance into the [facility] pending on 08/05/2023, indicates a plan for addressing potential substance use issues. Principal Problem: Major depressive disorder, single episode, severe (HCC) Diagnosis:   Total Time spent with patient: 1 hour  Past Psychiatric History: Depression  Past Medical History:  Past Medical History:  Diagnosis Date   Glaucoma    Hyperlipemia    Hypertension     Past Surgical History:  Procedure Laterality Date   LEG SURGERY     LUNG SURGERY     Family History: History reviewed. No pertinent family history. Family Psychiatric  History: none reported Social History:  Social History   Substance and Sexual Activity  Alcohol Use No     Social History   Substance and Sexual Activity  Drug Use Yes   Types: Cocaine, Marijuana    Social History   Socioeconomic History   Marital status: Single    Spouse name: Not on file   Number of children: Not on file   Years of education: Not on file   Highest education level: Not on file  Occupational History   Not on file  Tobacco Use   Smoking status: Every Day    Current packs/day: 0.50    Types: Cigarettes   Smokeless tobacco: Never  Substance and Sexual Activity   Alcohol use: No   Drug use: Yes     Types: Cocaine, Marijuana   Sexual activity: Not Currently  Other Topics Concern   Not on file  Social History Narrative   Not on file   Social Drivers of Health   Financial Resource Strain: Not on file  Food Insecurity: Food Insecurity Present (07/28/2023)   Hunger Vital Sign    Worried About Running Out of Food in the Last Year: Often true    Ran Out of Food in the Last Year: Often true  Transportation Needs: Unmet Transportation Needs (07/28/2023)   PRAPARE - Administrator, Civil Service (Medical): Yes    Lack of Transportation (Non-Medical): Yes  Physical Activity: Not on file  Stress: Not on file  Social Connections: Unknown (11/03/2021)   Received from [facility], [facility]   Social Network    Social Network: Not on file   Additional Social History:                         Sleep: Fair  Appetite:  Good  Current Medications: Current Facility-Administered Medications  Medication Dose Route Frequency Provider Last Rate Last Admin   alum & mag hydroxide-simeth (MAALOX/MYLANTA) 200-200-20 MG/5ML suspension 30 mL  30 mL Oral Q4H PRN Mardy Legacy, NP       amLODipine  (NORVASC ) tablet 5 mg  5 mg Oral Daily Nicholaus Brad RAMAN, NP   5 mg at 07/31/23 9077   haloperidol  (HALDOL ) tablet 5 mg  5 mg Oral TID PRN Mardy Legacy, NP  And   diphenhydrAMINE  (BENADRYL ) capsule 50 mg  50 mg Oral TID PRN Mardy Legacy, NP       haloperidol  lactate (HALDOL ) injection 5 mg  5 mg Intramuscular TID PRN Mardy Legacy, NP       And   diphenhydrAMINE  (BENADRYL ) injection 50 mg  50 mg Intramuscular TID PRN Mardy Legacy, NP       And   LORazepam  (ATIVAN ) injection 2 mg  2 mg Intramuscular TID PRN Mardy Legacy, NP       haloperidol  lactate (HALDOL ) injection 10 mg  10 mg Intramuscular TID PRN Mardy Legacy, NP       And   diphenhydrAMINE  (BENADRYL ) injection 50 mg  50 mg Intramuscular TID PRN Mardy Legacy, NP       And   LORazepam  (ATIVAN )  injection 2 mg  2 mg Intramuscular TID PRN Mardy Legacy, NP       DULoxetine  (CYMBALTA ) DR capsule 40 mg  40 mg Oral Daily Javian Nudd S, NP   40 mg at 07/31/23 9077   feeding supplement (ENSURE ENLIVE / ENSURE PLUS) liquid 237 mL  237 mL Oral BID BM Nicholaus Brad RAMAN, NP   237 mL at 07/31/23 9057   gabapentin  (NEURONTIN ) capsule 200 mg  200 mg Oral q morning Nicholaus Brad RAMAN, NP   200 mg at 07/31/23 9057   gabapentin  (NEURONTIN ) capsule 600 mg  600 mg Oral QHS Benito Lemmerman S, NP   600 mg at 07/30/23 2125   hydrOXYzine  (ATARAX ) tablet 25 mg  25 mg Oral TID PRN Mardy Legacy, NP   25 mg at 07/30/23 2124   ibuprofen  (ADVIL ) tablet 600 mg  600 mg Oral Q8H PRN Nicholaus Brad RAMAN, NP   600 mg at 07/31/23 9074   lidocaine  (XYLOCAINE ) 2 % viscous mouth solution 15 mL  15 mL Mouth/Throat Q6H PRN Nicholaus Brad RAMAN, NP   15 mL at 07/31/23 0930   magnesium  hydroxide (MILK OF MAGNESIA) suspension 30 mL  30 mL Oral Daily PRN Mardy Legacy, NP       melatonin tablet 5 mg  5 mg Oral QHS Nicholaus Brad RAMAN, NP   5 mg at 07/30/23 2125   multivitamin with minerals tablet 1 tablet  1 tablet Oral Daily Jadapalle, Sree, MD   1 tablet at 07/31/23 9056   nicotine  (NICODERM CQ  - dosed in mg/24 hours) patch 14 mg  14 mg Transdermal Daily Sherika Kubicki S, NP       traZODone  (DESYREL ) tablet 50 mg  50 mg Oral QHS PRN Mardy Legacy, NP   50 mg at 07/30/23 2124    Lab Results: No results found for this or any previous visit (from the past 48 hours).  Blood Alcohol level:  Lab Results  Component Value Date   ETH <10 07/27/2023   ETH <10 12/15/2022    Metabolic Disorder Labs: Lab Results  Component Value Date   HGBA1C 5.6 12/17/2022   MPG 114.02 12/17/2022   No results found for: PROLACTIN Lab Results  Component Value Date   CHOL 164 12/18/2022   TRIG 101 12/18/2022   HDL 40 (L) 12/18/2022   CHOLHDL 4.1 12/18/2022   VLDL 20 12/18/2022   LDLCALC 104 (H) 12/18/2022   LDLCALC 89 12/17/2022    Physical  Findings: AIMS:  , ,  ,  ,    CIWA:    COWS:  COWS Total Score: 0  Musculoskeletal: Strength & Muscle Tone: within normal limits Gait & Station: normal  Patient leans: N/A  Psychiatric Specialty Exam:  Presentation  General Appearance:  Appropriate for Environment  Eye Contact: Good (Exhibits minimal interaction during the assessment. Displays restlessness but remains cooperative throughout the evaluation.)  Speech: Clear and Coherent; Slow  Speech Volume: Decreased  Handedness: Right   Mood and Affect  Mood: Anxious; Depressed (feelings of sadness and anxiety.)  Affect: Tearful   Thought Process  Thought Processes: Coherent  Descriptions of Associations:Intact  Orientation:Full (Time, Place and Person)  Thought Content:WDL  History of Schizophrenia/Schizoaffective disorder:No   Hallucinations:Hallucinations: None  Ideas of Reference:None  Suicidal Thoughts:Suicidal Thoughts: Yes, Passive SI Passive Intent and/or Plan: Without Intent; Without Plan; With Access to Means; Without Means to Carry Out  Homicidal Thoughts:Homicidal Thoughts: No   Sensorium  Memory: Immediate Good; Recent Good; Remote Good  Judgment: Fair (evidenced by self-harm behaviors and current suicidal ideation.)  Insight: Fair (acknowledges distress but lacks full understanding of its implications.)   Executive Functions  Concentration: Fair  Attention Span: Fair  Recall: Good  Fund of Knowledge: Good  Language: Good   Psychomotor Activity  Psychomotor Activity: Psychomotor Activity: Normal   Assets  Assets: Communication Skills; Desire for Improvement; Resilience   Sleep  Sleep: Sleep: Fair Number of Hours of Sleep: 6    Physical Exam: Physical Exam Vitals and nursing note reviewed.  Constitutional:      Appearance: Normal appearance.  HENT:     Head: Normocephalic and atraumatic.     Nose: Nose normal.  Pulmonary:     Effort: Pulmonary  effort is normal.  Musculoskeletal:        General: Normal range of motion.     Cervical back: Normal range of motion.  Neurological:     General: No focal deficit present.     Mental Status: He is alert and oriented to person, place, and time. Mental status is at baseline.  Psychiatric:        Attention and Perception: Attention and perception normal.        Mood and Affect: Mood is anxious and depressed. Affect is tearful.        Speech: Speech normal.        Behavior: Behavior is slowed. Behavior is cooperative.        Thought Content: Thought content includes suicidal ideation.        Cognition and Memory: Cognition and memory normal.        Judgment: Judgment normal.    Review of Systems  Musculoskeletal:  Positive for back pain.  Psychiatric/Behavioral:  Positive for depression, substance abuse and suicidal ideas. The patient is nervous/anxious.   All other systems reviewed and are negative.  Blood pressure 114/77, pulse 81, temperature (!) 97.2 F (36.2 C), resp. rate (!) 22, height 5' 11 (1.803 m), weight 73.7 kg, SpO2 97%. Body mass index is 22.66 kg/m.   Treatment Plan Summary: Daily contact with patient to assess and evaluate symptoms and progress in treatment and Medication management Duloxetine  (Cymbalta ) 40 mg for neuropathic pain and musculoskeletal pain. Gabapentin  600 mg nightly adjunctive therapy for neuropathic pain Melatonin 5 mg for sleep support Initiate  with LCSW intensive individual psychotherapy with a focus on depression, suicidal ideation, substance abuse, and adjustment to homelessness Coordinate with social work to assist in obtaining stable housing and connecting him with community resources. Brad GORMAN Moats, NP 07/31/2023, 6:55 PM

## 2023-07-31 NOTE — Plan of Care (Signed)
  Problem: Education: Goal: Emotional status will improve Outcome: Progressing Goal: Mental status will improve Outcome: Progressing   Problem: Activity: Goal: Sleeping patterns will improve Outcome: Progressing   Problem: Physical Regulation: Goal: Ability to maintain clinical measurements within normal limits will improve Outcome: Progressing   Problem: Safety: Goal: Periods of time without injury will increase Outcome: Progressing   Problem: Coping: Goal: Will verbalize feelings Outcome: Progressing

## 2023-07-31 NOTE — Group Note (Signed)
 Recreation Therapy Group Note   Group Topic:Self-Esteem  Group Date: 07/31/2023 Start Time: 1000 End Time: 1055 Facilitators: Celestia Jeoffrey BRAVO, LRT, CTRS Location:  Craft Room  Group Description: Positive Affirmation Worksheet. Patients and LRT discussed the importance of self-love/self-esteem and things that cause it to fluctuate, including our mental health. Pts received a large index card and marker. Pts were prompted to write their first name on the card and then pass the card to their peer on the right. Pts were then encouraged to write a positive affirmation or give a compliment to that person. Pt's passed around cards until everyone was able to write one for each person. LRT then collected the index cards and handed them back to the original person. Pts then completed a worksheet that helps them identify 24 different strengths and qualities about themselves. Pt encouraged to read aloud at least 2 off their sheet to the group. LRT and pts discussed how this can be applied to daily life post-discharge.   Goal Area(s) Addressed: Patient will identify positive qualities about themselves. Patient will learn new positive affirmations.  Patient will recite positive qualities and affirmations aloud to the group.  Patient will practice positive self-talk.  Patient will increase communication.   Affect/Mood: N/A   Participation Level: Did not attend    Clinical Observations/Individualized Feedback: Patient did not attend group.   Plan: Continue to engage patient in RT group sessions 2-3x/week.   Jeoffrey BRAVO Celestia, LRT, CTRS 07/31/2023 12:41 PM

## 2023-07-31 NOTE — Group Note (Signed)
 LCSW Group Therapy Note  Group Date: 07/31/2023 Start Time: 1300 End Time: 1400   Type of Therapy and Topic:  Group Therapy: Anger Cues and Responses  Participation Level:  Did Not Attend   Description of Group:   In this group, patients learned how to recognize the physical, cognitive, emotional, and behavioral responses they have to anger-provoking situations.  They identified a recent time they became angry and how they reacted.  They analyzed how their reaction was possibly beneficial and how it was possibly unhelpful.  The group discussed a variety of healthier coping skills that could help with such a situation in the future.  Focus was placed on how helpful it is to recognize the underlying emotions to our anger, because working on those can lead to a more permanent solution as well as our ability to focus on the important rather than the urgent.  Therapeutic Goals: Patients will remember their last incident of anger and how they felt emotionally and physically, what their thoughts were at the time, and how they behaved. Patients will identify how their behavior at that time worked for them, as well as how it worked against them. Patients will explore possible new behaviors to use in future anger situations. Patients will learn that anger itself is normal and cannot be eliminated, and that healthier reactions can assist with resolving conflict rather than worsening situations.  Summary of Patient Progress:   Patient declined to attend group.    Therapeutic Modalities:   Cognitive Behavioral Therapy    Sherryle JINNY Margo, LCSWA 07/31/2023  2:07 PM

## 2023-07-31 NOTE — Progress Notes (Signed)
   07/31/23 1100  Psych Admission Type (Psych Patients Only)  Admission Status Voluntary  Psychosocial Assessment  Patient Complaints Anxiety;Depression;Self-harm behaviors  Eye Contact Poor  Facial Expression Sad  Affect Depressed  Speech Soft  Interaction Minimal  Motor Activity Restless  Appearance/Hygiene Unremarkable  Behavior Characteristics Cooperative  Aggressive Behavior  Targets Self  Type of Behavior Unprovoked;Verbal  Effect Self-harm  Thought Process  Coherency WDL  Content WDL  Delusions None reported or observed  Perception WDL  Hallucination None reported or observed  Judgment Limited  Confusion None  Danger to Self  Current suicidal ideation? Verbalizes  Description of Suicide Plan None vebalized  Self-Injurious Behavior Some self-injurious ideation observed or expressed.  No lethal plan expressed   Agreement Not to Harm Self Yes  Description of Agreement Verbal  Danger to Others  Danger to Others None reported or observed

## 2023-07-31 NOTE — Plan of Care (Signed)
  Problem: Education: Goal: Knowledge of [facility] Education information/materials will improve Outcome: Progressing Goal: Emotional status will improve Outcome: Progressing Goal: Mental status will improve Outcome: Progressing Goal: Verbalization of understanding the information provided will improve Outcome: Progressing   Problem: Activity: Goal: Interest or engagement in activities will improve Outcome: Progressing Goal: Sleeping patterns will improve Outcome: Progressing   Problem: Coping: Goal: Ability to verbalize frustrations and anger appropriately will improve Outcome: Progressing   Problem: Health Behavior/Discharge Planning: Goal: Ability to make decisions will improve Outcome: Progressing   Problem: Role Relationship: Goal: Will demonstrate positive changes in social behaviors and relationships Outcome: Progressing

## 2023-08-01 ENCOUNTER — Other Ambulatory Visit: Payer: Self-pay

## 2023-08-01 DIAGNOSIS — F322 Major depressive disorder, single episode, severe without psychotic features: Secondary | ICD-10-CM | POA: Diagnosis not present

## 2023-08-01 MED ORDER — GABAPENTIN 300 MG PO CAPS
600.0000 mg | ORAL_CAPSULE | Freq: Every day | ORAL | 0 refills | Status: AC
  Filled 2023-08-01: qty 60, 30d supply, fill #0

## 2023-08-01 MED ORDER — TRAZODONE HCL 50 MG PO TABS
50.0000 mg | ORAL_TABLET | Freq: Every evening | ORAL | 0 refills | Status: AC | PRN
  Filled 2023-08-01: qty 30, 30d supply, fill #0

## 2023-08-01 MED ORDER — NICOTINE 14 MG/24HR TD PT24
14.0000 mg | MEDICATED_PATCH | Freq: Every day | TRANSDERMAL | 0 refills | Status: AC
  Filled 2023-08-01: qty 28, 28d supply, fill #0

## 2023-08-01 MED ORDER — TAMSULOSIN HCL 0.4 MG PO CAPS
0.4000 mg | ORAL_CAPSULE | Freq: Every day | ORAL | 0 refills | Status: AC
  Filled 2023-08-01: qty 30, 30d supply, fill #0

## 2023-08-01 MED ORDER — ADULT MULTIVITAMIN W/MINERALS CH
1.0000 | ORAL_TABLET | Freq: Every day | ORAL | 0 refills | Status: AC
  Filled 2023-08-01: qty 30, 30d supply, fill #0

## 2023-08-01 MED ORDER — MELATONIN 5 MG PO TABS
5.0000 mg | ORAL_TABLET | Freq: Every day | ORAL | 0 refills | Status: AC
  Filled 2023-08-01: qty 30, 30d supply, fill #0

## 2023-08-01 MED ORDER — DULOXETINE HCL 20 MG PO CPEP
40.0000 mg | ORAL_CAPSULE | Freq: Every day | ORAL | 0 refills | Status: AC
  Filled 2023-08-01: qty 60, 30d supply, fill #0

## 2023-08-01 MED ORDER — HYDROXYZINE HCL 25 MG PO TABS
25.0000 mg | ORAL_TABLET | Freq: Three times a day (TID) | ORAL | 0 refills | Status: AC | PRN
  Filled 2023-08-01: qty 30, 10d supply, fill #0

## 2023-08-01 MED ORDER — ALBUTEROL SULFATE HFA 108 (90 BASE) MCG/ACT IN AERS
1.0000 | INHALATION_SPRAY | Freq: Four times a day (QID) | RESPIRATORY_TRACT | 0 refills | Status: AC | PRN
  Filled 2023-08-01: qty 18, 20d supply, fill #0

## 2023-08-01 MED ORDER — IBUPROFEN 600 MG PO TABS
600.0000 mg | ORAL_TABLET | Freq: Three times a day (TID) | ORAL | 0 refills | Status: AC | PRN
  Filled 2023-08-01: qty 30, 10d supply, fill #0

## 2023-08-01 MED ORDER — LIDOCAINE 5 % EX PTCH
1.0000 | MEDICATED_PATCH | CUTANEOUS | 0 refills | Status: AC
  Filled 2023-08-01: qty 30, 30d supply, fill #0

## 2023-08-01 MED ORDER — AMLODIPINE BESYLATE 5 MG PO TABS
5.0000 mg | ORAL_TABLET | Freq: Every day | ORAL | 0 refills | Status: AC
  Filled 2023-08-01: qty 30, 30d supply, fill #0

## 2023-08-01 MED ORDER — GABAPENTIN 100 MG PO CAPS
200.0000 mg | ORAL_CAPSULE | Freq: Every morning | ORAL | 0 refills | Status: AC
  Filled 2023-08-01: qty 60, 30d supply, fill #0

## 2023-08-01 NOTE — Group Note (Signed)
 Date:  08/01/2023 Time:  4:46 PM  Group Topic/Focus:  Outdoor Recreation    Participation Level:  Did Not Attend   Mellie Sprinkle [facility] 08/01/2023, 4:46 PM

## 2023-08-01 NOTE — Group Note (Signed)
 Recreation Therapy Group Note   Group Topic:Leisure Education  Group Date: 08/01/2023 Start Time: 1000 End Time: 1100 Facilitators: Celestia Jeoffrey BRAVO, LRT, CTRS Location:  Craft Room  Group Description: Leisure. Patients were given the option to choose from singing karaoke, coloring mandalas, using oil pastels, journaling, or playing with play-doh. LRT and pts discussed the meaning of leisure, the importance of participating in leisure during their free time/when they're outside of the hospital, as well as how our leisure interests can also serve as coping skills.   Goal Area(s) Addressed:  Patient will identify a current leisure interest.  Patient will learn the definition of "leisure". Patient will practice making a positive decision. Patient will have the opportunity to try a new leisure activity. Patient will communicate with peers and LRT.    Affect/Mood: N/A   Participation Level: Did not attend    Clinical Observations/Individualized Feedback: Patient did not attend group.   Plan: Continue to engage patient in RT group sessions 2-3x/week.   Jeoffrey BRAVO Celestia, LRT, CTRS 08/01/2023 12:10 PM

## 2023-08-01 NOTE — Group Note (Signed)
 Date:  08/01/2023 Time:  8:52 PM  Group Topic/Focus:  Managing Feelings:   The focus of this group is to identify what feelings patients have difficulty handling and develop a plan to handle them in a healthier way upon discharge.    Participation Level:  Minimal  Participation Quality:  Resistant  Affect:  Flat  Cognitive:  Alert  Insight: Limited  Engagement in Group:  None  Modes of Intervention:  Support  Additional Comments:  Patient presented within normal limits, however, was not an active participant in group. Patient stayed the entire group session and engaged with peers appropriately when they engaged with him. Patient has troubled but would not share what was upsetting him.   Elvira Langston L 08/01/2023, 8:52 PM

## 2023-08-01 NOTE — Progress Notes (Signed)
 Sitting up in bed in the dark on engagement.  Endorsed depression rated moderately severe, identified trigger include being here, homelessness, lack of a job.Anxiety rated mild; denied SI/HI and AVH.  Pt took all HS medications.

## 2023-08-01 NOTE — Group Note (Signed)
 Date:  07/31/2023 Time:  3:30 pm  Group Topic/Focus:  Outdoor Recreation    Participation Level:  Did Not Attend   Brian Daugherty [facility] 08/01/2023, 10:16 AM

## 2023-08-01 NOTE — Progress Notes (Signed)
 [facility] MD Progress Note  08/01/2023 6:08 PM Brian Daugherty  MRN:  [id number] Subjective:  58 year old African American male who inquired, When am I leaving? and requested, Can I have someone bring my clothes before I leave? He was informed that nursing staff would need to clear his belongings prior to discharge.The patient is expressing readiness for discharge and is proactively inquiring about the process, including the retrieval of personal belongings. Medication management has been addressed with a 30-day supply prescribed. 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 No pertinent family history. Family Psychiatric  History: none reported Social History:  Social History   Substance and Sexual Activity  Alcohol Use No     Social History   Substance and Sexual Activity  Drug Use Yes   Types: Cocaine, Marijuana    Social History   Socioeconomic History   Marital status: Single    Spouse name: Not on file   Number of children: Not on file   Years of education: Not on file   Highest education level: Not on file  Occupational History   Not on file  Tobacco Use   Smoking status: Every Day    Current packs/day: 0.50    Types: Cigarettes   Smokeless tobacco: Never  Substance and Sexual Activity   Alcohol use: No   Drug use: Yes    Types: Cocaine, Marijuana   Sexual activity: Not Currently  Other Topics Concern   Not on file  Social History Narrative    Not on file   Social Drivers of Health   Financial Resource Strain: Not on file  Food Insecurity: Food Insecurity Present (07/28/2023)   Hunger Vital Sign    Worried About Running Out of Food in the Last Year: Often true    Ran Out of Food in the Last Year: Often true  Transportation Needs: Unmet Transportation Needs (07/28/2023)   PRAPARE - Administrator, Civil Service (Medical): Yes    Lack of Transportation (Non-Medical): Yes  Physical Activity: Not on file  Stress: Not on file  Social Connections: Unknown (11/03/2021)   Received from [facility], [facility]   Social Network    Social Network: Not on file   Additional Social History:                         Sleep: Good  Appetite:  Good  Current Medications: Current Facility-Administered Medications  Medication Dose Route Frequency Provider Last Rate Last Admin   alum & mag hydroxide-simeth (MAALOX/MYLANTA) 200-200-20 MG/5ML suspension 30 mL  30 mL Oral Q4H PRN Mardy Legacy, NP       amLODipine  (NORVASC ) tablet 5 mg  5 mg Oral Daily Nicholaus Brad RAMAN, NP   5 mg at 08/01/23 1026   haloperidol  (HALDOL ) tablet 5 mg  5 mg Oral TID PRN Mardy Legacy, NP       And   diphenhydrAMINE  (BENADRYL ) capsule 50  mg  50 mg Oral TID PRN Mardy Legacy, NP       haloperidol  lactate (HALDOL ) injection 5 mg  5 mg Intramuscular TID PRN Mardy Legacy, NP       And   diphenhydrAMINE  (BENADRYL ) injection 50 mg  50 mg Intramuscular TID PRN Mardy Legacy, NP       And   LORazepam  (ATIVAN ) injection 2 mg  2 mg Intramuscular TID PRN Mardy Legacy, NP       haloperidol  lactate (HALDOL ) injection 10 mg  10 mg Intramuscular TID PRN Mardy Legacy, NP       And   diphenhydrAMINE  (BENADRYL ) injection 50 mg  50 mg Intramuscular TID PRN Mardy Legacy, NP       And   LORazepam  (ATIVAN ) injection 2 mg  2 mg Intramuscular TID PRN Mardy Legacy, NP       DULoxetine  (CYMBALTA ) DR capsule 40 mg  40 mg Oral Daily  Andrewjames Weirauch S, NP   40 mg at 08/01/23 1028   feeding supplement (ENSURE ENLIVE / ENSURE PLUS) liquid 237 mL  237 mL Oral BID BM Nicholaus Brad RAMAN, NP   237 mL at 08/01/23 1033   gabapentin  (NEURONTIN ) capsule 200 mg  200 mg Oral q morning Nicholaus Brad RAMAN, NP   200 mg at 08/01/23 1026   gabapentin  (NEURONTIN ) capsule 600 mg  600 mg Oral QHS Shawnda Mauney S, NP   600 mg at 07/31/23 2106   hydrOXYzine  (ATARAX ) tablet 25 mg  25 mg Oral TID PRN Mardy Legacy, NP   25 mg at 07/30/23 2124   ibuprofen  (ADVIL ) tablet 600 mg  600 mg Oral Q8H PRN Delsa Walder S, NP   600 mg at 08/01/23 1027   magnesium  hydroxide (MILK OF MAGNESIA) suspension 30 mL  30 mL Oral Daily PRN Mardy Legacy, NP       melatonin tablet 5 mg  5 mg Oral QHS Marylan Glore S, NP   5 mg at 07/31/23 2107   multivitamin with minerals tablet 1 tablet  1 tablet Oral Daily Jadapalle, Sree, MD   1 tablet at 08/01/23 1027   nicotine  (NICODERM CQ  - dosed in mg/24 hours) patch 14 mg  14 mg Transdermal Daily Ames Hoban S, NP       traZODone  (DESYREL ) tablet 50 mg  50 mg Oral QHS PRN Mardy Legacy, NP   50 mg at 07/31/23 2107    Lab Results: No results found for this or any previous visit (from the past 48 hours).  Blood Alcohol level:  Lab Results  Component Value Date   ETH <10 07/27/2023   ETH <10 12/15/2022    Metabolic Disorder Labs: Lab Results  Component Value Date   HGBA1C 5.6 12/17/2022   MPG 114.02 12/17/2022   No results found for: PROLACTIN Lab Results  Component Value Date   CHOL 164 12/18/2022   TRIG 101 12/18/2022   HDL 40 (L) 12/18/2022   CHOLHDL 4.1 12/18/2022   VLDL 20 12/18/2022   LDLCALC 104 (H) 12/18/2022   LDLCALC 89 12/17/2022    Physical Findings: AIMS:  , ,  ,  ,    CIWA:    COWS:  COWS Total Score: 0  Musculoskeletal: Strength & Muscle Tone: within normal limits Gait & Station: normal Patient leans: N/A  Psychiatric Specialty Exam:  Presentation  General Appearance:  Appropriate for  Environment; Neat  Eye Contact: Good (Calm and cooperative during the interview.)  Speech: Clear and Coherent; Normal Rate  Speech Volume: Normal  Handedness: Right   Mood and Affect  Mood: Anxious (The patient reports feeling ready to go home.)  Affect: Appropriate; Congruent; Flat (displays appropriate range and intensity.)   Thought Process  Thought Processes: Coherent; Goal Directed  Descriptions of Associations:Intact  Orientation:Full (Time, Place and Person)  Thought Content:Logical; WDL  History of Schizophrenia/Schizoaffective disorder:No  Duration of Psychotic Symptoms:N/A  Hallucinations:Hallucinations: None  Ideas of Reference:None  Suicidal Thoughts:Suicidal Thoughts: No SI Passive Intent and/or Plan: -- (denies)  Homicidal Thoughts:Homicidal Thoughts: No   Sensorium  Memory: Immediate Good; Recent Good; Remote Good  Judgment: Good (Exhibits sound judgment, as evidenced by appropriate planning for discharge and understanding of follow-up care.)  Insight: Good (Demonstrates good understanding of his condition and the importance of continuing treatment post-discharge.)   Executive Functions  Concentration: Good  Attention Span: Good  Recall: Good  Fund of Knowledge: Good  Language: Good   Psychomotor Activity  Psychomotor Activity: Psychomotor Activity: Normal   Assets  Assets: Communication Skills; Housing; Desire for Improvement; Resilience   Sleep  Sleep: Sleep: Good Number of Hours of Sleep: 7    Physical Exam: Physical Exam Vitals and nursing note reviewed.  Constitutional:      Appearance: Normal appearance.  HENT:     Head: Normocephalic and atraumatic.     Nose: Nose normal.  Pulmonary:     Effort: Pulmonary effort is normal.  Musculoskeletal:        General: Normal range of motion.     Cervical back: Normal range of motion.  Neurological:     General: No focal deficit present.     Mental  Status: He is alert and oriented to person, place, and time. Mental status is at baseline.  Psychiatric:        Attention and Perception: Attention and perception normal.        Mood and Affect: Mood is anxious. Affect is flat.        Speech: Speech normal.        Behavior: Behavior normal. Behavior is cooperative.        Thought Content: Thought content normal.        Cognition and Memory: Cognition and memory normal.        Judgment: Judgment normal.    Review of Systems  Musculoskeletal:  Positive for back pain and joint pain.  Psychiatric/Behavioral:  The patient is nervous/anxious.   All other systems reviewed and are negative.  Blood pressure 120/82, pulse 70, temperature 97.7 F (36.5 C), resp. rate (!) 22, height 5' 11 (1.803 m), weight 73.7 kg, SpO2 99%. Body mass index is 22.66 kg/m.   Treatment Plan Summary: Daily contact with patient to assess and evaluate symptoms and progress in treatment and Medication management Provide the patient with a 30-day supply of prescribed medications. Duloxetine  (Cymbalta ) 40 mg for neuropathic pain and musculoskeletal pain. Gabapentin  600 mg nightly adjunctive therapy for neuropathic pain Melatonin 5 mg for sleep support Initiate  with LCSW intensive individual psychotherapy with a focus on depression, suicidal ideation, substance abuse, and adjustment to homelessness Coordinate with social work to assist in obtaining stable housing and connecting him with community resources. Brad GORMAN Moats, NP 08/01/2023, 6:08 PM

## 2023-08-01 NOTE — Progress Notes (Signed)
   08/01/23 1800  Psych Admission Type (Psych Patients Only)  Admission Status Voluntary  Psychosocial Assessment  Patient Complaints Anxiety;Depression  Eye Contact Poor  Facial Expression Sad  Affect Depressed  Speech Soft  Interaction Minimal  Motor Activity Restless  Appearance/Hygiene In scrubs  Behavior Characteristics Cooperative  Aggressive Behavior  Targets Self  Type of Behavior Unprovoked  Effect Self-harm  Thought Process  Coherency WDL  Content WDL  Delusions None reported or observed  Perception WDL  Hallucination None reported or observed  Judgment Impaired  Confusion None  Danger to Self  Current suicidal ideation? Verbalizes  Self-Injurious Behavior No self-injurious ideation or behavior indicators observed or expressed   Agreement Not to Harm Self Yes  Description of Agreement Verbal  Danger to Others  Danger to Others None reported or observed

## 2023-08-01 NOTE — Group Note (Signed)
 Date:  08/01/2023 Time:  10:27 AM  Group Topic/Focus:  Goals Group:   The focus of this group is to help patients establish daily goals to achieve during treatment and discuss how the patient can incorporate goal setting into their daily lives to aide in recovery.    Participation Level:  Did Not Attend   Brian Daugherty [facility] 08/01/2023, 10:27 AM

## 2023-08-01 NOTE — Plan of Care (Signed)
  Problem: Education: Goal: Mental status will improve Outcome: Progressing   Problem: Activity: Goal: Interest or engagement in activities will improve Outcome: Not Progressing   

## 2023-08-02 DIAGNOSIS — F322 Major depressive disorder, single episode, severe without psychotic features: Secondary | ICD-10-CM | POA: Diagnosis not present

## 2023-08-02 MED ORDER — PREDNISONE 10 MG (21) PO TBPK
20.0000 mg | ORAL_TABLET | Freq: Every morning | ORAL | Status: AC
  Filled 2023-08-02: qty 21

## 2023-08-02 MED ORDER — PREDNISONE 10 MG (21) PO TBPK
20.0000 mg | ORAL_TABLET | Freq: Every evening | ORAL | Status: AC
  Administered 2023-08-03: 20 mg via ORAL

## 2023-08-02 MED ORDER — PREDNISONE 10 MG (21) PO TBPK
10.0000 mg | ORAL_TABLET | Freq: Four times a day (QID) | ORAL | Status: DC
  Administered 2023-08-04 – 2023-08-05 (×5): 10 mg via ORAL

## 2023-08-02 MED ORDER — WHITE PETROLATUM EX OINT
TOPICAL_OINTMENT | CUTANEOUS | Status: DC | PRN
  Administered 2023-08-02: 1 via TOPICAL
  Filled 2023-08-02: qty 5

## 2023-08-02 MED ORDER — PREDNISONE 10 MG (21) PO TBPK: 10.0000 mg | ORAL_TABLET | ORAL | Status: AC

## 2023-08-02 MED ORDER — PREDNISONE 10 MG (21) PO TBPK
10.0000 mg | ORAL_TABLET | Freq: Three times a day (TID) | ORAL | Status: AC
  Administered 2023-08-03 (×3): 10 mg via ORAL

## 2023-08-02 MED ORDER — PREDNISONE 10 MG (21) PO TBPK
20.0000 mg | ORAL_TABLET | Freq: Every evening | ORAL | Status: AC
  Administered 2023-08-02: 20 mg via ORAL

## 2023-08-02 NOTE — Group Note (Signed)
 Date:  08/02/2023 Time:  2:33 PM  Group Topic/Focus:  Activity Group: The focus of this group is to promote activity for the patients and encourage them to go outside to the courtyard for some fresh air and some exercise.    Participation Level:  Did Not Attend   Camellia HERO Nicholos Aloisi 08/02/2023, 2:33 PM

## 2023-08-02 NOTE — Progress Notes (Signed)
   08/02/23 1700  Psych Admission Type (Psych Patients Only)  Admission Status Voluntary  Psychosocial Assessment  Patient Complaints Depression  Eye Contact Poor  Facial Expression Sad  Affect Depressed  Speech Soft  Interaction Minimal  Motor Activity Restless  Appearance/Hygiene Unremarkable  Behavior Characteristics Cooperative  Mood Depressed  Thought Process  Coherency WDL  Content WDL  Delusions None reported or observed  Perception WDL  Hallucination None reported or observed  Judgment Impaired  Confusion None  Danger to Self  Current suicidal ideation? Passive  Agreement Not to Harm Self Yes  Description of Agreement verbal  Danger to Others  Danger to Others None reported or observed

## 2023-08-02 NOTE — Group Note (Signed)
 Date:  08/02/2023 Time:  2:28 PM  Group Topic/Focus:  Healthy Communication:   The focus of this group is to discuss communication, barriers to communication, as well as healthy ways to communicate with others.    Participation Level:  Did Not Attend   Brian Daugherty 08/02/2023, 2:28 PM

## 2023-08-02 NOTE — Plan of Care (Signed)
  Problem: Education: Goal: Knowledge of [facility] Education information/materials will improve 08/02/2023 1115 by Trudy Arland CROME, RN Outcome: Progressing 08/02/2023 1114 by Trudy Arland CROME, RN Outcome: Not Progressing   Problem: Education: Goal: Emotional status will improve Outcome: Not Progressing   Problem: Education: Goal: Mental status will improve Outcome: Not Progressing   Problem: Education: Goal: Verbalization of understanding the information provided will improve 08/02/2023 1115 by Trudy Arland CROME, RN Outcome: Progressing 08/02/2023 1114 by Trudy Arland CROME, RN Outcome: Not Progressing

## 2023-08-02 NOTE — Progress Notes (Signed)
 [facility] MD Progress Note  08/02/2023 4:59 PM Brian Daugherty  MRN:  [id number] Subjective:  58 year old African American male,ports back pain, currently managed with a prednisone  taper prescribed by the ED physician. He denies auditory or visual hallucinations (AVH), suicidal ideation (SI), or homicidal ideation (HI).Patient is noted to have minimal participation in the milieu but remains cooperative with treatment. 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 No pertinent family history. Family Psychiatric  History: none reported Social History:  Social History   Substance and Sexual Activity  Alcohol Use No     Social History   Substance and Sexual Activity  Drug Use Yes   Types: Cocaine, Marijuana    Social History   Socioeconomic History   Marital status: Single    Spouse name: Not on file   Number of children: Not on file   Years of education: Not on file   Highest education level: Not on file  Occupational History   Not on file  Tobacco Use   Smoking status: Every Day    Current packs/day: 0.50    Types: Cigarettes   Smokeless tobacco: Never  Substance and Sexual Activity   Alcohol use: No   Drug use: Yes    Types: Cocaine, Marijuana   Sexual activity: Not Currently  Other Topics Concern   Not  on file  Social History Narrative   Not on file   Social Drivers of Health   Financial Resource Strain: Not on file  Food Insecurity: Food Insecurity Present (07/28/2023)   Hunger Vital Sign    Worried About Running Out of Food in the Last Year: Often true    Ran Out of Food in the Last Year: Often true  Transportation Needs: Unmet Transportation Needs (07/28/2023)   PRAPARE - Administrator, Civil Service (Medical): Yes    Lack of Transportation (Non-Medical): Yes  Physical Activity: Not on file  Stress: Not on file  Social Connections: Unknown (11/03/2021)   Received from [facility]   Social Network    Social Network: Not on file   Additional Social History:                         Sleep: Good  Appetite:  Good  Current Medications: Current Facility-Administered Medications  Medication Dose Route Frequency Provider Last Rate Last Admin   alum & mag hydroxide-simeth (MAALOX/MYLANTA) 200-200-20 MG/5ML suspension 30 mL  30 mL Oral Q4H PRN Mardy Legacy, NP       amLODipine  (NORVASC ) tablet 5 mg  5 mg Oral Daily Nicholaus Brad RAMAN, NP   5 mg at 08/02/23 0950   haloperidol  (HALDOL ) tablet 5 mg  5 mg Oral TID PRN Mardy Legacy, NP       And   diphenhydrAMINE  (BENADRYL ) capsule  50 mg  50 mg Oral TID PRN Mardy Legacy, NP       haloperidol  lactate (HALDOL ) injection 5 mg  5 mg Intramuscular TID PRN Mardy Legacy, NP       And   diphenhydrAMINE  (BENADRYL ) injection 50 mg  50 mg Intramuscular TID PRN Mardy Legacy, NP       And   LORazepam  (ATIVAN ) injection 2 mg  2 mg Intramuscular TID PRN Mardy Legacy, NP       haloperidol  lactate (HALDOL ) injection 10 mg  10 mg Intramuscular TID PRN Mardy Legacy, NP       And   diphenhydrAMINE  (BENADRYL ) injection 50 mg  50 mg Intramuscular TID PRN Mardy Legacy, NP       And   LORazepam  (ATIVAN ) injection 2 mg  2 mg Intramuscular TID PRN Mardy Legacy, NP       DULoxetine  (CYMBALTA )  DR capsule 40 mg  40 mg Oral Daily Derin Matthes S, NP   40 mg at 08/02/23 0950   feeding supplement (ENSURE ENLIVE / ENSURE PLUS) liquid 237 mL  237 mL Oral BID BM Nicholaus Brad RAMAN, NP   237 mL at 08/01/23 1033   gabapentin  (NEURONTIN ) capsule 200 mg  200 mg Oral q morning Nicholaus Brad RAMAN, NP   200 mg at 08/02/23 9049   gabapentin  (NEURONTIN ) capsule 600 mg  600 mg Oral QHS Saxon Barich S, NP   600 mg at 08/01/23 2129   hydrOXYzine  (ATARAX ) tablet 25 mg  25 mg Oral TID PRN Mardy Legacy, NP   25 mg at 07/30/23 2124   ibuprofen  (ADVIL ) tablet 600 mg  600 mg Oral Q8H PRN Jullien Granquist S, NP   600 mg at 08/02/23 9048   magnesium  hydroxide (MILK OF MAGNESIA) suspension 30 mL  30 mL Oral Daily PRN Mardy Legacy, NP       melatonin tablet 5 mg  5 mg Oral QHS Jaivyn Gulla S, NP   5 mg at 08/01/23 2130   multivitamin with minerals tablet 1 tablet  1 tablet Oral Daily Jadapalle, Sree, MD   1 tablet at 08/02/23 9049   nicotine  (NICODERM CQ  - dosed in mg/24 hours) patch 14 mg  14 mg Transdermal Daily Nicholaus Brad RAMAN, NP       predniSONE  (STERAPRED UNI-PAK 21 TAB) tablet 10 mg  10 mg Oral PC lunch Nicholaus Brad RAMAN, NP       predniSONE  (STERAPRED UNI-PAK 21 TAB) tablet 10 mg  10 mg Oral PC supper Nicholaus Brad RAMAN, NP       [START ON 08/03/2023] predniSONE  (STERAPRED UNI-PAK 21 TAB) tablet 10 mg  10 mg Oral 3 x daily with food Nicholaus Brad RAMAN, NP       [START ON 08/04/2023] predniSONE  (STERAPRED UNI-PAK 21 TAB) tablet 10 mg  10 mg Oral 4X daily taper Nicholaus Brad RAMAN, NP       predniSONE  (STERAPRED UNI-PAK 21 TAB) tablet 20 mg  20 mg Oral AC breakfast Nicholaus Brad RAMAN, NP       predniSONE  (STERAPRED UNI-PAK 21 TAB) tablet 20 mg  20 mg Oral Nightly Emie Sommerfeld S, NP       [START ON 08/03/2023] predniSONE  (STERAPRED UNI-PAK 21 TAB) tablet 20 mg  20 mg Oral Nightly Nicholaus Brad RAMAN, NP       traZODone  (DESYREL ) tablet 50 mg  50 mg Oral QHS PRN Mardy Legacy, NP   50 mg at 08/01/23 2130    Lab Results: No results  found for this or any  previous visit (from the past 48 hours).  Blood Alcohol level:  Lab Results  Component Value Date   ETH <10 07/27/2023   ETH <10 12/15/2022    Metabolic Disorder Labs: Lab Results  Component Value Date   HGBA1C 5.6 12/17/2022   MPG 114.02 12/17/2022   No results found for: PROLACTIN Lab Results  Component Value Date   CHOL 164 12/18/2022   TRIG 101 12/18/2022   HDL 40 (L) 12/18/2022   CHOLHDL 4.1 12/18/2022   VLDL 20 12/18/2022   LDLCALC 104 (H) 12/18/2022   LDLCALC 89 12/17/2022    Physical Findings: AIMS:  , ,  ,  ,    CIWA:    COWS:  COWS Total Score: 0  Musculoskeletal: Strength & Muscle Tone: within normal limits Gait & Station: normal Patient leans: N/A  Psychiatric Specialty Exam:  Presentation  General Appearance:  Appropriate for Environment  Eye Contact: Good  Speech: Normal Rate; Clear and Coherent  Speech Volume: Normal  Handedness: Right   Mood and Affect  Mood: -- (Neutral.)  Affect: Appropriate; Flat   Thought Process  Thought Processes: Coherent; Goal Directed  Descriptions of Associations:Intact  Orientation:Full (Time, Place and Person)  Thought Content:WDL  History of Schizophrenia/Schizoaffective disorder:No  Duration of Psychotic Symptoms:N/A  Hallucinations:Hallucinations: None  Ideas of Reference:None  Suicidal Thoughts:Suicidal Thoughts: No SI Passive Intent and/or Plan: -- (denies)  Homicidal Thoughts:Homicidal Thoughts: No   Sensorium  Memory: Immediate Good; Recent Good; Remote Good  Judgment: Fair (acknowledges need for inpatient rehabilitation)  Insight: Fair   Art Therapist  Concentration: Fair  Attention Span: Fair  Recall: Metta  Fund of Knowledge: Good  Language: Good   Psychomotor Activity  Psychomotor Activity: Psychomotor Activity: Normal   Assets  Assets: Communication Skills; Desire for Improvement; Resilience   Sleep  Sleep: Sleep: Good Number  of Hours of Sleep: 6    Physical Exam: Physical Exam Vitals and nursing note reviewed.  Constitutional:      Appearance: Normal appearance.  HENT:     Head: Normocephalic and atraumatic.     Nose: Nose normal.  Pulmonary:     Effort: Pulmonary effort is normal.  Musculoskeletal:        General: Normal range of motion.     Cervical back: Normal range of motion.  Neurological:     General: No focal deficit present.     Mental Status: He is alert. Mental status is at baseline.  Psychiatric:        Attention and Perception: Attention and perception normal.        Mood and Affect: Mood and affect normal.        Speech: Speech normal.        Behavior: Behavior is cooperative.        Thought Content: Thought content normal.        Cognition and Memory: Cognition and memory normal.        Judgment: Judgment normal.    Review of Systems  Musculoskeletal:  Positive for back pain and joint pain.  All other systems reviewed and are negative.  Blood pressure 110/71, pulse (!) 59, temperature 97.9 F (36.6 C), resp. rate 20, height 5' 11 (1.803 m), weight 73.7 kg, SpO2 98%. Body mass index is 22.66 kg/m.   Treatment Plan Summary: Daily contact with patient to assess and evaluate symptoms and progress in treatment and Medication management Continue monitoring back pain and prednisone  taper response.  30-day  medication supply ordered for post-discharge continuity. Duloxetine  (Cymbalta ) 40 mg for neuropathic pain and musculoskeletal pain. Gabapentin  600 mg nightly adjunctive therapy for neuropathic pain Melatonin 5 mg for sleep support Initiate  with LCSW intensive individual psychotherapy with a focus on depression, suicidal ideation, substance abuse, and adjustment to homelessness Coordinate with social work to assist in obtaining stable housing and connecting him with community resource Brad GORMAN Moats, NP 08/02/2023, 4:59 PM

## 2023-08-02 NOTE — Progress Notes (Signed)
 Pt isolative to his room tonight. Pt did come up and get his scheduled night time medication. Pt endorses passive SI, verbally contracts for safety. Pt compliant with medication administration per MD orders. Pt given education, support, and encouragement to be active in his treatment plan. Pt being monitored Q 15 minutes for safety per unit protocol, remains safe on the unit

## 2023-08-02 NOTE — Plan of Care (Signed)
  Problem: Education: Goal: Knowledge of [facility] Education information/materials will improve Outcome: Progressing Goal: Emotional status will improve Outcome: Progressing Goal: Mental status will improve Outcome: Progressing Goal: Verbalization of understanding the information provided will improve Outcome: Progressing   Problem: Activity: Goal: Interest or engagement in activities will improve Outcome: Progressing Goal: Sleeping patterns will improve Outcome: Progressing   Problem: Coping: Goal: Ability to verbalize frustrations and anger appropriately will improve Outcome: Progressing Goal: Ability to demonstrate self-control will improve Outcome: Progressing   Problem: Health Behavior/Discharge Planning: Goal: Identification of resources available to assist in meeting health care needs will improve Outcome: Progressing Goal: Compliance with treatment plan for underlying cause of condition will improve Outcome: Progressing   Problem: Physical Regulation: Goal: Ability to maintain clinical measurements within normal limits will improve Outcome: Progressing   Problem: Safety: Goal: Periods of time without injury will increase Outcome: Progressing   Problem: Education: Goal: Utilization of techniques to improve thought processes will improve Outcome: Progressing Goal: Knowledge of the prescribed therapeutic regimen will improve Outcome: Progressing   Problem: Activity: Goal: Interest or engagement in leisure activities will improve Outcome: Progressing Goal: Imbalance in normal sleep/wake cycle will improve Outcome: Progressing   Problem: Coping: Goal: Coping ability will improve Outcome: Progressing Goal: Will verbalize feelings Outcome: Progressing   Problem: Health Behavior/Discharge Planning: Goal: Ability to make decisions will improve Outcome: Progressing Goal: Compliance with therapeutic regimen will improve Outcome: Progressing    Problem: Role Relationship: Goal: Will demonstrate positive changes in social behaviors and relationships Outcome: Progressing   Problem: Safety: Goal: Ability to disclose and discuss suicidal ideas will improve Outcome: Progressing Goal: Ability to identify and utilize support systems that promote safety will improve Outcome: Progressing   Problem: Self-Concept: Goal: Will verbalize positive feelings about self Outcome: Progressing Goal: Level of anxiety will decrease Outcome: Progressing   Problem: Education: Goal: Ability to make informed decisions regarding treatment will improve Outcome: Progressing   Problem: Coping: Goal: Coping ability will improve Outcome: Progressing   Problem: Health Behavior/Discharge Planning: Goal: Identification of resources available to assist in meeting health care needs will improve Outcome: Progressing   Problem: Medication: Goal: Compliance with prescribed medication regimen will improve Outcome: Progressing   Problem: Self-Concept: Goal: Ability to disclose and discuss suicidal ideas will improve Outcome: Progressing Goal: Will verbalize positive feelings about self Outcome: Progressing Note: Patient is on track. Patient will maintain adherence

## 2023-08-03 DIAGNOSIS — F322 Major depressive disorder, single episode, severe without psychotic features: Secondary | ICD-10-CM | POA: Diagnosis not present

## 2023-08-03 NOTE — Group Note (Signed)
 Date:  08/03/2023 Time:  11:02 PM  Group Topic/Focus:  Wrap-Up Group:   The focus of this group is to help patients review their daily goal of treatment and discuss progress on daily workbooks.    Participation Level:  Active  Participation Quality:  Appropriate  Affect:  Appropriate  Cognitive:  Appropriate  Insight: Appropriate  Engagement in Group:  Engaged  Modes of Intervention:  Discussion  Maglione,Corry Storie E 08/03/2023, 11:02 PM

## 2023-08-03 NOTE — Progress Notes (Signed)
 [facility] MD Progress Note  08/03/2023 7:15 PM Brian Daugherty  MRN:  [id number] Subjective:  58 year old African American male,  is engaging in the milieu but with some limitations. He is interacting but remains somewhat guarded. The patient is requesting more information regarding new rehabilitation options. Patient remains cooperative but exhibits signs of depression. He will continue to be monitored for mood stability, engagement in treatment, and decision-making regarding rehabilitation options. 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 No pertinent family history. Family Psychiatric  History: none reported Social History:  Social History   Substance and Sexual Activity  Alcohol Use No     Social History   Substance and Sexual Activity  Drug Use Yes   Types: Cocaine, Marijuana    Social History   Socioeconomic History   Marital status: Single    Spouse name: Not on file   Number of children: Not on file   Years of education: Not on file   Highest education level: Not on file  Occupational History   Not on file  Tobacco Use   Smoking status: Every Day    Current packs/day: 0.50    Types: Cigarettes   Smokeless tobacco: Never  Substance and Sexual Activity   Alcohol use: No   Drug use: Yes    Types: Cocaine, Marijuana   Sexual activity: Not Currently  Other Topics Concern   Not on file  Social History Narrative   Not on file   Social Drivers of  Health   Financial Resource Strain: Not on file  Food Insecurity: Food Insecurity Present (07/28/2023)   Hunger Vital Sign    Worried About Running Out of Food in the Last Year: Often true    Ran Out of Food in the Last Year: Often true  Transportation Needs: Unmet Transportation Needs (07/28/2023)   PRAPARE - Administrator, Civil Service (Medical): Yes    Lack of Transportation (Non-Medical): Yes  Physical Activity: Not on file  Stress: Not on file  Social Connections: Unknown (11/03/2021)   [facility] from [facility], [facility]   Social Network    Social Network: Not on file   Additional Social History:                         Sleep: Good  Appetite:  Good  Current Medications: Current Facility-Administered Medications  Medication Dose Route Frequency Provider Last Rate Last Admin   alum & mag hydroxide-simeth (MAALOX/MYLANTA) 200-200-20 MG/5ML suspension 30 mL  30 mL Oral Q4H PRN Mardy Legacy, NP       amLODipine  (NORVASC ) tablet 5 mg  5 mg Oral Daily Nicholaus Brad RAMAN, NP   5 mg at 08/03/23 0900   haloperidol  (HALDOL ) tablet 5 mg  5 mg Oral TID PRN Mardy Legacy, NP       And   diphenhydrAMINE  (BENADRYL ) capsule 50 mg  50 mg Oral TID PRN Mardy Legacy, NP  haloperidol  lactate (HALDOL ) injection 5 mg  5 mg Intramuscular TID PRN Mardy Legacy, NP       And   diphenhydrAMINE  (BENADRYL ) injection 50 mg  50 mg Intramuscular TID PRN Mardy Legacy, NP       And   LORazepam  (ATIVAN ) injection 2 mg  2 mg Intramuscular TID PRN Mardy Legacy, NP       haloperidol  lactate (HALDOL ) injection 10 mg  10 mg Intramuscular TID PRN Mardy Legacy, NP       And   diphenhydrAMINE  (BENADRYL ) injection 50 mg  50 mg Intramuscular TID PRN Mardy Legacy, NP       And   LORazepam  (ATIVAN ) injection 2 mg  2 mg Intramuscular TID PRN Mardy Legacy, NP       DULoxetine  (CYMBALTA ) DR capsule 40 mg  40 mg Oral Daily Garlin Batdorf S, NP   40 mg at 08/03/23  0900   feeding supplement (ENSURE ENLIVE / ENSURE PLUS) liquid 237 mL  237 mL Oral BID BM Nicholaus Brad RAMAN, NP   237 mL at 08/03/23 0911   gabapentin  (NEURONTIN ) capsule 200 mg  200 mg Oral q morning Nicholaus Brad RAMAN, NP   200 mg at 08/03/23 9092   gabapentin  (NEURONTIN ) capsule 600 mg  600 mg Oral QHS Antwanette Wesche S, NP   600 mg at 08/02/23 2139   hydrOXYzine  (ATARAX ) tablet 25 mg  25 mg Oral TID PRN Mardy Legacy, NP   25 mg at 07/30/23 2124   ibuprofen  (ADVIL ) tablet 600 mg  600 mg Oral Q8H PRN Breunna Nordmann S, NP   600 mg at 08/02/23 2143   magnesium  hydroxide (MILK OF MAGNESIA) suspension 30 mL  30 mL Oral Daily PRN Mardy Legacy, NP       melatonin tablet 5 mg  5 mg Oral QHS Leith Szafranski S, NP   5 mg at 08/02/23 2139   multivitamin with minerals tablet 1 tablet  1 tablet Oral Daily Jadapalle, Sree, MD   1 tablet at 08/03/23 0900   nicotine  (NICODERM CQ  - dosed in mg/24 hours) patch 14 mg  14 mg Transdermal Daily Nicholaus Brad RAMAN, NP       [START ON 08/04/2023] predniSONE  (STERAPRED UNI-PAK 21 TAB) tablet 10 mg  10 mg Oral 4X daily taper Nicholaus Brad RAMAN, NP       predniSONE  (STERAPRED UNI-PAK 21 TAB) tablet 20 mg  20 mg Oral Nightly Chanae Gemma S, NP       traZODone  (DESYREL ) tablet 50 mg  50 mg Oral QHS PRN Mardy Legacy, NP   50 mg at 08/01/23 2130   white petrolatum  (VASELINE) gel   Topical PRN Melvenia Madelene HERO, NP   1 Application at 08/02/23 2307    Lab Results: No results found for this or any previous visit (from the past 48 hours).  Blood Alcohol level:  Lab Results  Component Value Date   ETH <10 07/27/2023   ETH <10 12/15/2022    Metabolic Disorder Labs: Lab Results  Component Value Date   HGBA1C 5.6 12/17/2022   MPG 114.02 12/17/2022   No results found for: PROLACTIN Lab Results  Component Value Date   CHOL 164 12/18/2022   TRIG 101 12/18/2022   HDL 40 (L) 12/18/2022   CHOLHDL 4.1 12/18/2022   VLDL 20 12/18/2022   LDLCALC 104 (H) 12/18/2022   LDLCALC 89 12/17/2022     Physical Findings: AIMS:  , ,  ,  ,    CIWA:  COWS:  COWS Total Score: 0  Musculoskeletal: Strength & Muscle Tone: within normal limits Gait & Station: normal Patient leans: N/A  Psychiatric Specialty Exam:  Presentation  General Appearance:  Appropriate for Environment  Eye Contact: Good  Speech: Normal Rate; Clear and Coherent  Speech Volume: Normal  Handedness: Right   Mood and Affect  Mood: -- (Neutral.)  Affect: Appropriate; Flat   Thought Process  Thought Processes: Coherent; Goal Directed  Descriptions of Associations:Intact  Orientation:Full (Time, Place and Person)  Thought Content:WDL  History of Schizophrenia/Schizoaffective disorder:No  Duration of Psychotic Symptoms:N/A  Hallucinations:Hallucinations: None  Ideas of Reference:None  Suicidal Thoughts:Suicidal Thoughts: No SI Passive Intent and/or Plan: -- (denies)  Homicidal Thoughts:Homicidal Thoughts: No   Sensorium  Memory: Immediate Good; Recent Good; Remote Good  Judgment: Fair (acknowledges need for inpatient rehabilitation)  Insight: Fair   Art Therapist  Concentration: Fair  Attention Span: Fair  Recall: Metta  Fund of Knowledge: Good  Language: Good   Psychomotor Activity  Psychomotor Activity: Psychomotor Activity: Normal   Assets  Assets: Communication Skills; Desire for Improvement; Resilience   Sleep  Sleep: Sleep: Good Number of Hours of Sleep: 6    Physical Exam: Physical Exam Vitals and nursing note reviewed.  Constitutional:      Appearance: Normal appearance.  HENT:     Head: Normocephalic and atraumatic.     Nose: Nose normal.  Pulmonary:     Effort: Pulmonary effort is normal.  Musculoskeletal:        General: Normal range of motion.     Cervical back: Normal range of motion.  Neurological:     General: No focal deficit present.     Mental Status: He is alert and oriented to person, place, and time.  Mental status is at baseline.  Psychiatric:        Attention and Perception: Attention and perception normal.        Mood and Affect: Mood is anxious and depressed.        Speech: Speech normal.        Behavior: Behavior normal. Behavior is cooperative.        Thought Content: Thought content normal.        Cognition and Memory: Cognition and memory normal.        Judgment: Judgment normal.    Review of Systems  Musculoskeletal:  Positive for back pain.  Psychiatric/Behavioral:  Positive for depression. The patient is nervous/anxious.   All other systems reviewed and are negative.  Blood pressure 131/86, pulse 72, temperature 98.4 F (36.9 C), resp. rate 18, height 5' 11 (1.803 m), weight 73.7 kg, SpO2 99%. Body mass index is 22.66 kg/m.   Treatment Plan Summary: Daily contact with patient to assess and evaluate symptoms and progress in treatment and Medication management Continue monitoring back pain and prednisone  taper response per ED MD  30-day medication supply ordered for post-discharge continuity. Duloxetine  (Cymbalta ) 40 mg for neuropathic pain and musculoskeletal pain. Gabapentin  600 mg nightly adjunctive therapy for neuropathic pain Melatonin 5 mg for sleep support Initiate  with LCSW intensive individual psychotherapy with a focus on depression, suicidal ideation, substance abuse, and adjustment to homelessness Coordinate with social work to assist in obtaining stable housing and connecting him with community resource Brad GORMAN Moats, NP 08/03/2023, 7:15 PM

## 2023-08-03 NOTE — Group Note (Signed)
 Date:  08/03/2023 Time:  10:13 AM  Group Topic/Focus:  Goals Group:   The focus of this group is to help patients establish daily goals to achieve during treatment and discuss how the patient can incorporate goal setting into their daily lives to aide in recovery. Healthy Communication:   The focus of this group is to discuss communication, barriers to communication, as well as healthy ways to communicate with others. Making Healthy Choices:   The focus of this group is to help patients identify negative/unhealthy choices they were using prior to admission and identify positive/healthier coping strategies to replace them upon discharge.    Participation Level:  Did Not Attend  Shayley Medlin 08/03/2023, 10:13 AM

## 2023-08-03 NOTE — Progress Notes (Signed)
   08/03/23 1600  Psych Admission Type (Psych Patients Only)  Admission Status Voluntary  Psychosocial Assessment  Patient Complaints Depression  Eye Contact Poor  Facial Expression Sad  Affect Depressed  Speech Soft  Interaction Guarded  Motor Activity Slow  Appearance/Hygiene Improved  Behavior Characteristics Cooperative  Mood Depressed  Thought Process  Coherency WDL  Content WDL  Delusions None reported or observed  Perception WDL  Hallucination None reported or observed  Judgment Impaired  Confusion None  Danger to Self  Current suicidal ideation? Denies  Danger to Others  Danger to Others None reported or observed

## 2023-08-03 NOTE — Group Note (Signed)
 Date:  08/03/2023 Time:  5:41 PM  Group Topic/Focus:  Outdoor recreation  therapy  structured activity    Participation Level:  Did Not Attend   Annabelle Barrack 08/03/2023, 5:41 PM

## 2023-08-03 NOTE — Progress Notes (Signed)
   08/03/23 2200  Psych Admission Type (Psych Patients Only)  Admission Status Voluntary  Psychosocial Assessment  Patient Complaints Depression  Eye Contact Poor  Facial Expression Sad  Affect Depressed  Speech Soft  Interaction Guarded  Motor Activity Slow  Appearance/Hygiene Improved  Behavior Characteristics Cooperative  Mood Pleasant  Thought Process  Coherency WDL  Content WDL  Delusions None reported or observed  Perception WDL  Hallucination None reported or observed  Judgment Impaired  Confusion None  Danger to Self  Current suicidal ideation? Denies  Danger to Others  Danger to Others None reported or observed

## 2023-08-03 NOTE — Progress Notes (Signed)
   08/02/23 2000  Psych Admission Type (Psych Patients Only)  Admission Status Voluntary  Psychosocial Assessment  Patient Complaints Depression  Eye Contact Poor  Facial Expression Sad  Affect Depressed  Speech Soft  Interaction Forwards little;Guarded  Motor Activity Slow  Appearance/Hygiene Improved  Behavior Characteristics Cooperative;Appropriate to situation  Mood Pleasant  Thought Process  Coherency WDL  Content WDL  Delusions None reported or observed  Perception WDL  Hallucination None reported or observed  Judgment Impaired  Confusion None  Danger to Self  Current suicidal ideation? Denies  Agreement Not to Harm Self Yes   Patient is alert and oriented x4, affect is blunted thought are organized, he denies SI/HI/AVH. 15 minutes safety checks maintained will continue to monitor.

## 2023-08-03 NOTE — Group Note (Signed)
 Date:  08/03/2023 Time:  9:06 PM  Group Topic/Focus:  Wellness Toolbox:   The focus of this group is to discuss various aspects of wellness, balancing those aspects and exploring ways to increase the ability to experience wellness.  Patients will create a wellness toolbox for use upon discharge. Wrap-Up Group:   The focus of this group is to help patients review their daily goal of treatment and discuss progress on daily workbooks.    Participation Level:  Active  Participation Quality:  Appropriate and Attentive  Affect:  Appropriate  Cognitive:  Alert and Appropriate  Insight: Good  Engagement in Group:  Supportive  Modes of Intervention:  Discussion and Support  Additional Comments:     Maglione,Cressie Betzler E 08/03/2023, 9:06 PM

## 2023-08-03 NOTE — Plan of Care (Signed)
  Problem: Activity: Goal: Sleeping patterns will improve Outcome: Progressing   

## 2023-08-03 NOTE — Plan of Care (Signed)
  Problem: Education: Goal: Verbalization of understanding the information provided will improve Outcome: Progressing   Problem: Education: Goal: Mental status will improve Outcome: Progressing   Problem: Education: Goal: Knowledge of [place] [facility] Education information/materials will improve Outcome: Progressing

## 2023-08-04 DIAGNOSIS — F322 Major depressive disorder, single episode, severe without psychotic features: Secondary | ICD-10-CM | POA: Diagnosis not present

## 2023-08-04 LAB — URINE DRUG SCREEN, QUALITATIVE (ARMC ONLY)
Amphetamines, Ur Screen: NOT DETECTED
Barbiturates, Ur Screen: NOT DETECTED
Benzodiazepine, Ur Scrn: NOT DETECTED
Cannabinoid 50 Ng, Ur ~~LOC~~: POSITIVE — AB
Cocaine Metabolite,Ur ~~LOC~~: NOT DETECTED
MDMA (Ecstasy)Ur Screen: NOT DETECTED
Methadone Scn, Ur: NOT DETECTED
Opiate, Ur Screen: NOT DETECTED
Phencyclidine (PCP) Ur S: NOT DETECTED
Tricyclic, Ur Screen: NOT DETECTED

## 2023-08-04 MED ORDER — TRAZODONE HCL 50 MG PO TABS
150.0000 mg | ORAL_TABLET | Freq: Every evening | ORAL | Status: DC | PRN
  Administered 2023-08-04: 150 mg via ORAL
  Filled 2023-08-04: qty 1

## 2023-08-04 NOTE — Progress Notes (Signed)
   08/04/23 1000  Psych Admission Type (Psych Patients Only)  Admission Status Voluntary  Psychosocial Assessment  Patient Complaints Other (Comment) (not sleeping/ meds did not help/ notified NP)  Eye Contact Fair  Facial Expression Other (Comment) (WNL)  Affect Depressed  Speech Logical/coherent  Interaction Minimal  Motor Activity Slow  Appearance/Hygiene Unremarkable  Behavior Characteristics Cooperative;Pacing  Mood Pleasant  Thought Process  Coherency WDL  Content WDL  Delusions None reported or observed  Perception WDL  Hallucination None reported or observed  Judgment Impaired  Confusion None  Danger to Self  Current suicidal ideation? Denies  Agreement Not to Harm Self Yes  Description of Agreement verbal  Danger to Others  Danger to Others None reported or observed

## 2023-08-04 NOTE — Group Note (Signed)
 Date:  08/04/2023 Time:  10:30 PM  Group Topic/Focus:  Orientation:   The focus of this group is to educate the patient on the purpose and policies of crisis stabilization and provide a format to answer questions about their admission.  The group details unit policies and expectations of patients while admitted.    Participation Level:  Active  Participation Quality:  Appropriate, Attentive, and Sharing  Affect:  Appropriate  Cognitive:  Alert and Appropriate  Insight: Appropriate, Good, and Improving  Engagement in Group:  Developing/Improving and Engaged  Modes of Intervention:  Clarification, Discussion, Orientation, Rapport Building, and Support  Additional Comments:     Brian Daugherty 08/04/2023, 10:30 PM

## 2023-08-04 NOTE — Progress Notes (Signed)
[facility] MD Progress Note  08/04/2023 12:20 PM Brian Daugherty  MRN:  [id number]  58 year old male with a history of chronic lower back pain with right lower extremity radiculopathy followed by [facility], presenting for evaluation of worsening right leg pain and suicidal ideation. The patient expresses active suicidal ideation (SI), citing worsening depression due to decreased mobility and chronic pain. He states that he fears overdosing on his medications if discharged today. He reports a history of cocaine use disorder, stating that he has been using cocaine almost daily for the past few years, drinks alcohol occasionally (not daily), and uses marijuana.   Subjective:  Chart reviewed, case discussed with multidisciplinary team, and patient seen today on rounds. Primary complaint of insomnia. States he has not slept more than 2 hours per night and every medication has been ineffective. He denies thoughts of harming self and others. He denies perceptual disturbances. He denies any signs or symptoms of withdrawal. He is adherent with medication regimen. States he is seeking long-term substance use treatment program at discharge. Per social work, patient has been accepted to [facility].  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 No pertinent family history. Family Psychiatric  History: none reported Social History:  Social History   Substance and Sexual Activity  Alcohol  Use No     Social History   Substance and Sexual Activity  Drug Use Yes   Types: Cocaine, Marijuana    Social History   Socioeconomic History   Marital status: Single    Spouse name: Not on file   Number of children: Not on file   Years of education: Not on file   Highest education level: Not on file  Occupational History   Not on file  Tobacco Use   Smoking status: Every Day    Current packs/day: 0.50    Types: Cigarettes   Smokeless tobacco: Never  Substance and Sexual Activity   Alcohol use: No   Drug use: Yes    Types: Cocaine, Marijuana   Sexual activity: Not Currently  Other Topics Concern   Not on file  Social History Narrative   Not on file   Social Drivers of Health   Financial Resource Strain: Not on file  Food Insecurity: Food Insecurity Present (07/28/2023)   Hunger Vital Sign    Worried About Running Out of Food in the Last Year: Often true    Ran Out of Food in the Last Year: Often true  Transportation Needs: Unmet Transportation Needs (07/28/2023)   PRAPARE - Administrator, Civil Service (Medical): Yes    Lack of Transportation (Non-Medical): Yes  Physical Activity: Not on file  Stress: Not on file  Social Connections: Unknown (11/03/2021)   [facility] from [facility]   Social Network    Social Network: Not on file   Additional Social History:  Sleep: Poor  Appetite:  Good  Current Medications: Current Facility-Administered Medications  Medication Dose Route Frequency Provider Last Rate Last Admin   alum & mag hydroxide-simeth (MAALOX/MYLANTA) 200-200-20 MG/5ML suspension 30 mL  30 mL Oral Q4H PRN Eligha Bridegroom, NP       amLODipine (NORVASC) tablet 5 mg  5 mg Oral Daily Myriam Forehand, NP   5 mg at 08/04/23 0825   haloperidol (HALDOL) tablet 5 mg  5 mg Oral TID PRN Eligha Bridegroom, NP       And   diphenhydrAMINE (BENADRYL) capsule 50 mg  50 mg Oral TID PRN Eligha Bridegroom, NP        haloperidol lactate (HALDOL) injection 5 mg  5 mg Intramuscular TID PRN Eligha Bridegroom, NP       And   diphenhydrAMINE (BENADRYL) injection 50 mg  50 mg Intramuscular TID PRN Eligha Bridegroom, NP       And   LORazepam (ATIVAN) injection 2 mg  2 mg Intramuscular TID PRN Eligha Bridegroom, NP       haloperidol lactate (HALDOL) injection 10 mg  10 mg Intramuscular TID PRN Eligha Bridegroom, NP       And   diphenhydrAMINE (BENADRYL) injection 50 mg  50 mg Intramuscular TID PRN Eligha Bridegroom, NP       And   LORazepam (ATIVAN) injection 2 mg  2 mg Intramuscular TID PRN Eligha Bridegroom, NP       DULoxetine (CYMBALTA) DR capsule 40 mg  40 mg Oral Daily Myriam Forehand, NP   40 mg at 08/04/23 0825   feeding supplement (ENSURE ENLIVE / ENSURE PLUS) liquid 237 mL  237 mL Oral BID BM Myriam Forehand, NP   237 mL at 08/03/23 0911   gabapentin (NEURONTIN) capsule 200 mg  200 mg Oral q morning Myriam Forehand, NP   200 mg at 08/04/23 1610   gabapentin (NEURONTIN) capsule 600 mg  600 mg Oral QHS Myriam Forehand, NP   600 mg at 08/03/23 2144   hydrOXYzine (ATARAX) tablet 25 mg  25 mg Oral TID PRN Eligha Bridegroom, NP   25 mg at 08/03/23 2145   ibuprofen (ADVIL) tablet 600 mg  600 mg Oral Q8H PRN Myriam Forehand, NP   600 mg at 08/02/23 2143   magnesium hydroxide (MILK OF MAGNESIA) suspension 30 mL  30 mL Oral Daily PRN Eligha Bridegroom, NP       melatonin tablet 5 mg  5 mg Oral QHS Myriam Forehand, NP   5 mg at 08/03/23 2145   multivitamin with minerals tablet 1 tablet  1 tablet Oral Daily Verner Chol, MD   1 tablet at 08/04/23 9604   nicotine (NICODERM CQ - dosed in mg/24 hours) patch 14 mg  14 mg Transdermal Daily Myriam Forehand, NP       predniSONE (STERAPRED UNI-PAK 21 TAB) tablet 10 mg  10 mg Oral 4X daily taper Myriam Forehand, NP   10 mg at 08/04/23 5409   traZODone (DESYREL) tablet 150 mg  150 mg Oral QHS PRN Darrold Junker E, NP       white petrolatum (VASELINE) gel   Topical PRN Jearld Lesch, NP    1 Application at 08/02/23 2307    Lab Results: No results found for this or any previous visit (from the past 48 hours).  Blood Alcohol level:  Lab Results  Component Value Date   ETH <10 07/27/2023   ETH <10 12/15/2022  Metabolic Disorder Labs: Lab Results  Component Value Date   HGBA1C 5.6 12/17/2022   MPG 114.02 12/17/2022   No results found for: "PROLACTIN" Lab Results  Component Value Date   CHOL 164 12/18/2022   TRIG 101 12/18/2022   HDL 40 (L) 12/18/2022   CHOLHDL 4.1 12/18/2022   VLDL 20 12/18/2022   LDLCALC 104 (H) 12/18/2022   LDLCALC 89 12/17/2022    Physical Findings: AIMS:  , ,  ,  ,    CIWA:    COWS:  COWS Total Score: 0  Musculoskeletal: Strength & Muscle Tone: within normal limits Gait & Station: normal Patient leans: N/A  Psychiatric Specialty Exam:  Presentation  General Appearance:  Appropriate for Environment; Neat  Eye Contact: Good  Speech: Clear and Coherent; Normal Rate  Speech Volume: Normal  Handedness: Right   Mood and Affect  Mood: "Good"  Affect: constricted   Thought Process  Thought Processes: Coherent  Descriptions of Associations:Intact  Orientation:Full (Time, Place and Person)  Thought Content:Logical; WDL  History of Schizophrenia/Schizoaffective disorder:No  Duration of Psychotic Symptoms:N/A  Hallucinations:Hallucinations: None  Ideas of Reference:None  Suicidal Thoughts:Suicidal Thoughts: No SI Passive Intent and/or Plan: -- (denies)  Homicidal Thoughts:Homicidal Thoughts: No   Sensorium  Memory: Immediate Good; Recent Good; Remote Good  Judgment: Good  Insight: Good   Executive Functions  Concentration: Fair  Attention Span: Fair  Recall: Good  Fund of Knowledge: Good  Language: Good   Psychomotor Activity  Psychomotor Activity: Psychomotor Activity: Normal   Assets  Assets: Communication Skills; Desire for Improvement; Talents/Skills;  Resilience   Sleep  Poor    Physical Exam: Physical Exam Vitals and nursing note reviewed.  Constitutional:      Appearance: Normal appearance.  HENT:     Head: Normocephalic and atraumatic.     Nose: Nose normal.  Pulmonary:     Effort: Pulmonary effort is normal.  Musculoskeletal:        General: Normal range of motion.     Cervical back: Normal range of motion.  Neurological:     General: No focal deficit present.     Mental Status: He is alert and oriented to person, place, and time. Mental status is at baseline.  Psychiatric:        Attention and Perception: Attention and perception normal.        Mood and Affect: Mood and affect normal.        Speech: Speech normal.        Behavior: Behavior normal. Behavior is cooperative.        Thought Content: Thought content normal.        Cognition and Memory: Cognition and memory normal.        Judgment: Judgment normal.    Review of Systems  Musculoskeletal:  Positive for back pain.  Psychiatric/Behavioral:  The patient has insomnia.   All other systems reviewed and are negative.  Blood pressure 125/87, pulse 71, temperature 97.9 F (36.6 C), resp. rate 18, height 5\' 11"  (1.803 m), weight 73.7 kg, SpO2 98%. Body mass index is 22.66 kg/m.   PLAN:  1.    Safety and Monitoring:              --  Voluntary admission to inpatient psychiatric unit for safety, stabilization and treatment             -- Daily contact with patient to assess and evaluate symptoms and progress in treatment             --  Patient's case to be discussed in multi-disciplinary team meeting             -- Observation Level : q15 minute checks             -- Vital signs:  q12 hours             -- Precautions: suicide  2. Psychiatric Diagnoses and Treatment:             -- Continue Cymbalta 40mg  PO daily for MDD  -- Continue melatonin 5mg  PO at bedtime for sleep  -- Increase trazodone 150mg  PO at bedtime PRN insomnia --  The  risks/benefits/side-effects/alternatives to this medication were discussed in detail with the patient and time was given for questions. The patient consents to medication trial.             -- Encouraged patient to participate in unit milieu and in scheduled group therapies             -- Short Term Goals: Ability to identify changes in lifestyle to reduce recurrence of condition will improve, Ability to verbalize feelings will improve, Ability to disclose and discuss suicidal ideas, Ability to demonstrate self-control will improve, Ability to identify and develop effective coping behaviors will improve, Ability to maintain clinical measurements within normal limits will improve, Compliance with prescribed medications will improve, and Ability to identify triggers associated with substance abuse/mental health issues will improve             -- Long Term Goals: Improvement in symptoms so as ready for discharge   3. Medical Issues Being Addressed:   --Continue monitoring back pain and prednisone taper response per ED MD  -- Continue Norvasc 5mg  PO daily for HTN  -- Continue gabapentin 200mg  PO QAM and 600mg  PO at bedtime for neuropathic pain   4. Discharge Planning:              -- Social work and case management to assist with discharge planning and identification of hospital follow-up needs prior to discharge             -- Estimated LOS: 5-7 days             -- Discharge Concerns: Need to establish a safety plan; Medication compliance and effectiveness             -- Discharge Goals: long-term substance use treatment   Maresa Morash E Larysa Pall, NP 08/04/2023, 12:20 PM

## 2023-08-04 NOTE — Group Note (Signed)
 Recreation Therapy Group Note   Group Topic:Coping Skills  Group Date: 08/04/2023 Start Time: 1000 End Time: 1100 Facilitators: Deatrice Factor, LRT, CTRS Location:  Craft Room  Group Description: Mind Map.  Patient was provided a blank template of a diagram with 32 blank boxes in a tiered system, branching from the center (similar to a bubble chart). LRT directed patients to label the middle of the diagram "Coping Skills". LRT and patients then came up with 8 different coping skills as examples. Pt were directed to record their coping skills in the 2nd tier boxes closest to the center.  Patients would then share their coping skills with the group as LRT wrote them out. LRT gave a handout of 99 different coping skills at the end of group.   Goal Area(s) Addressed: Patients will be able to define "coping skills". Patient will identify new coping skills.  Patient will increase communication.   Affect/Mood: N/A   Participation Level: Did not attend    Clinical Observations/Individualized Feedback: Patient did not attend group.   Plan: Continue to engage patient in RT group sessions 2-3x/week.   Deatrice Factor, LRT, CTRS 08/04/2023 11:44 AM

## 2023-08-04 NOTE — Progress Notes (Signed)
   08/04/23 1600  Spiritual Encounters  Type of Visit Follow up  Care provided to: Patient  Referral source Patient request  Reason for visit Religious ritual  OnCall Visit No   Chaplain took patient a Bible at the request of Chaplain Rana. Patient was very appreciative of receiving the Bible. Chaplain services remain available for spiritual and emotional support.

## 2023-08-04 NOTE — BHH Counselor (Signed)
 CSW spoke with [place] at [facility].  She confirmed that patient has been ACCEPTED and is to arrive by 10AM.  Shasta Deist, MSW, LCSW 08/04/2023 12:57 PM

## 2023-08-04 NOTE — BHH Suicide Risk Assessment (Signed)
Suicide Risk Assessment  Discharge Assessment    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)   Chronic pain syndrome   Total Time spent with patient: 45 minutes  Musculoskeletal: Strength & Muscle Tone: within normal limits Gait & Station: normal Patient leans: N/A  Psychiatric Specialty Exam  Presentation  General Appearance:  Appropriate for Environment; Neat  Eye Contact: Good  Speech: Clear and Coherent; Normal Rate  Speech Volume: Normal  Handedness: Right   Mood and Affect  Mood: Anxious  Duration of Depression Symptoms: No data recorded Affect: Depressed; Flat   Thought Process  Thought Processes: Coherent  Descriptions of Associations:Intact  Orientation:Full (Time, Place and Person)  Thought Content:Logical; WDL  History of Schizophrenia/Schizoaffective disorder:No  Duration of Psychotic Symptoms:N/A  Hallucinations:Hallucinations: None  Ideas of Reference:None  Suicidal Thoughts:Suicidal Thoughts: No SI Passive Intent and/or Plan: -- (denies)  Homicidal Thoughts:Homicidal Thoughts: No   Sensorium  Memory: Immediate Good; Recent Good; Remote Good  Judgment: Fair  Insight: Fair   Art therapist  Concentration: Fair  Attention Span: Fair  Recall: Good  Fund of Knowledge: Good  Language: Good   Psychomotor Activity  Psychomotor Activity: Psychomotor Activity: Normal   Assets  Assets: Communication Skills; Desire for Improvement; Talents/Skills; Resilience   Sleep  Sleep: Sleep: Good Number of Hours of Sleep: 6   Physical Exam: Physical Exam ROS Blood pressure (!) 149/94, pulse 66, temperature 98.3 F (36.8 C), resp. rate 17, height 5\' 11"   (1.803 m), weight 73.7 kg, SpO2 99%. Body mass index is 22.66 kg/m.  Mental Status Per Nursing Assessment::   On Admission:  Self-harm thoughts  Demographic Factors:  Male and Unemployed  Loss Factors: NA  Historical Factors: Impulsivity  Risk Reduction Factors:   Sense of responsibility to family and Positive social support  Continued Clinical Symptoms:  Depression:   Insomnia Alcohol/Substance Abuse/Dependencies Chronic Pain Previous Psychiatric Diagnoses and Treatments Medical Diagnoses and Treatments/Surgeries  Cognitive Features That Contribute To Risk:  None    Suicide Risk:  Minimal: No identifiable suicidal ideation.  Patients presenting with no risk factors but with morbid ruminations; may be classified as minimal risk based on the severity of the depressive symptoms   Follow-up Information     [facility] Follow up.   Specialty: Addiction Medicine Why: Patient to arriveby 10AM Contact information: [address] [place] [address] [phone]                 Plan Of Care/Follow-up recommendations:   It is recommended to the patient to continue psychiatric medications as prescribed, after discharge from the hospital.   - It is recommended to the patient to follow up with your outpatient psychiatric provider and PCP. - It was discussed with the patient, the impact of alcohol, drugs, tobacco have been there overall psychiatric and medical wellbeing, and total abstinence from substance use was recommended the patient. - 30-day supply of medications provided. Patient agreeable to plan. Given opportunity to ask questions. Appears to feel comfortable with discharge.   - In the event of worsening symptoms, the patient is instructed to call the crisis hotline, 911 and or go to the nearest ED for appropriate evaluation and treatment of symptoms. To follow-up with primary care provider for other medical issues, concerns  and or  health care needs - Patient will be discharged to [facility] on 08/05/2023 at 8am  Donice Alperin E Kemal Amores, NP 08/04/2023, 5:06 PM

## 2023-08-04 NOTE — Progress Notes (Addendum)
  [facility] Adult Case Management Discharge Plan :  Will you be returning to the same living situation after discharge:  No. Patient going to inpatient treatment At discharge, do you have transportation home?: Yes,  CSW to assist with transportation needs Do you have the ability to pay for your medications: Yes,  BLUE CROSS BLUE SHIELD / [employer] NON-PARTICIPATING [place]  Release of information consent forms completed and in the chart;  Patient's signature needed at discharge.  Patient to Follow up at:  Follow-up Information     [facility] Follow up.   Specialty: Addiction Medicine Why: Patient to arriveby 10AM Contact information: [address] [place] [address] [phone]                 Next level of care provider has access to [facility] Link:no  Safety Planning and Suicide Prevention discussed: Yes,  SPE completed with the patient.      Has patient been referred to the Quitline?: Patient refused referral for treatment  Patient has been referred for addiction treatment: Yes, the patient will follow up with an inpatient provider for substance use disorder. Psychiatrist/APP: appointment made 08/05/2023 at [facility], LCSW 08/04/2023, 4:07 PM

## 2023-08-04 NOTE — BH IP Treatment Plan (Signed)
 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)   Chronic pain syndrome   Current Medications:  Current Facility-Administered Medications  Medication Dose Route Frequency Provider Last Rate Last Admin   alum & mag hydroxide-simeth (MAALOX/MYLANTA) 200-200-20 MG/5ML suspension 30 mL  30 mL Oral Q4H PRN Roise Cleaver, NP       amLODipine  (NORVASC ) tablet 5 mg  5 mg Oral Daily Rosalene Colon, NP   5 mg at 08/04/23 0825   haloperidol  (HALDOL ) tablet 5 mg  5 mg Oral TID PRN Roise Cleaver, NP       And   diphenhydrAMINE  (BENADRYL ) capsule 50 mg  50 mg Oral TID PRN Roise Cleaver, NP       haloperidol  lactate (HALDOL ) injection 5 mg  5 mg Intramuscular TID PRN Roise Cleaver, NP       And   diphenhydrAMINE  (BENADRYL ) injection 50 mg  50 mg Intramuscular TID PRN Roise Cleaver, NP       And   LORazepam  (ATIVAN ) injection 2 mg  2 mg Intramuscular TID PRN Roise Cleaver, NP       haloperidol  lactate (HALDOL ) injection 10 mg  10 mg Intramuscular TID PRN Roise Cleaver, NP       And   diphenhydrAMINE  (BENADRYL ) injection 50 mg  50 mg Intramuscular TID PRN Roise Cleaver, NP       And   LORazepam  (ATIVAN ) injection 2 mg  2 mg Intramuscular TID PRN Roise Cleaver, NP       DULoxetine  (CYMBALTA ) DR capsule 40 mg  40 mg Oral Daily Rosalene Colon, NP   40 mg at 08/04/23 0825   feeding supplement (ENSURE ENLIVE / ENSURE PLUS) liquid 237 mL  237 mL Oral BID BM Rosalene Colon, NP   237 mL at 08/03/23 0911   gabapentin  (NEURONTIN ) capsule 200 mg  200 mg Oral q morning Rosalene Colon, NP   200 mg at 08/04/23 3086   gabapentin  (NEURONTIN ) capsule 600 mg  600 mg  Oral QHS Davis, Nina S, NP   600 mg at 08/03/23 2144   hydrOXYzine  (ATARAX ) tablet 25 mg  25 mg Oral TID PRN Roise Cleaver, NP   25 mg at 08/03/23 2145   ibuprofen  (ADVIL ) tablet 600 mg  600 mg Oral Q8H PRN Davis, Nina S, NP   600 mg at 08/02/23 2143   magnesium  hydroxide (MILK OF MAGNESIA) suspension 30 mL  30 mL Oral Daily PRN Roise Cleaver, NP       melatonin tablet 5 mg  5 mg Oral QHS Rosalene Colon, NP   5 mg at 08/03/23 2145   multivitamin with minerals tablet 1 tablet  1 tablet Oral Daily Jadapalle, Sree, MD   1 tablet at 08/04/23 5784   nicotine  (NICODERM CQ  - dosed in mg/24 hours) patch 14 mg  14 mg Transdermal Daily Rosalene Colon, NP       predniSONE  (STERAPRED UNI-PAK 21 TAB) tablet 10 mg  10 mg Oral 4X daily taper Davis, Nina S, NP   10 mg at 08/04/23 6962   traZODone  (DESYREL ) tablet 150 mg  150 mg Oral QHS PRN Remington, Amber E, NP       white petrolatum  (VASELINE) gel   Topical PRN  Al Alias, NP   1 Application at 08/02/23 2307   PTA Medications: Medications Prior to Admission  Medication Sig Dispense Refill Last Dose/Taking   FLUoxetine  (PROZAC ) 20 MG capsule Take 1 capsule (20 mg total) by mouth daily. 30 capsule 0    gabapentin  (NEURONTIN ) 300 MG capsule Take 600 mg by mouth at bedtime.      predniSONE  (STERAPRED UNI-PAK 21 TAB) 10 MG (21) TBPK tablet Take by mouth daily. Take 6 tabs by mouth daily  for 1 days, then 5 tabs for 1 days, then 4 tabs for 1 days, then 3 tabs for 1 days, 2 tabs for 1 days, then 1 tab by mouth daily for 1 days 21 tablet 0    promethazine-dextromethorphan (PROMETHAZINE-DM) 6.25-15 MG/5ML syrup Take 5 mLs by mouth 4 (four) times daily as needed for cough.      sildenafil (VIAGRA) 100 MG tablet Take 100 mg by mouth as needed for erectile dysfunction.      [DISCONTINUED] albuterol  (VENTOLIN  HFA) 108 (90 Base) MCG/ACT inhaler Inhale 1 puff into the lungs every 6 (six) hours as needed for shortness of breath or wheezing.      [DISCONTINUED]  amLODipine  (NORVASC ) 5 MG tablet Take 1 tablet (5 mg total) by mouth daily. 30 tablet 0    [DISCONTINUED] lidocaine  (LIDODERM ) 5 % Place 1 patch onto the skin daily. Remove & Discard patch within 12 hours or as directed by MD 30 patch 0    [DISCONTINUED] tamsulosin  (FLOMAX ) 0.4 MG CAPS capsule Take 1 capsule by mouth daily.      [DISCONTINUED] traZODone  (DESYREL ) 50 MG tablet Take 1 tablet (50 mg total) by mouth at bedtime as needed for sleep. 30 tablet 0     Patient Stressors: Financial difficulties   Traumatic event    Patient Strengths: Average or above average intelligence  Capable of independent living   Treatment Modalities: Medication Management, Group therapy, Case management,  1 to 1 session with clinician, Psychoeducation, Recreational therapy.   Physician Treatment Plan for Primary Diagnosis: Major depressive disorder, single episode, severe (HCC) Long Term Goal(s): Improvement in symptoms so as ready for discharge   Short Term Goals: Ability to identify changes in lifestyle to reduce recurrence of condition will improve Ability to verbalize feelings will improve Ability to disclose and discuss suicidal ideas Ability to demonstrate self-control will improve Ability to identify and develop effective coping behaviors will improve Ability to maintain clinical measurements within normal limits will improve Compliance with prescribed medications will improve Ability to identify triggers associated with substance abuse/mental health issues will improve  Medication Management: Evaluate patient's response, side effects, and tolerance of medication regimen.  Therapeutic Interventions: 1 to 1 sessions, Unit Group sessions and Medication administration.  Evaluation of Outcomes: Progressing  Physician Treatment Plan for Secondary Diagnosis: Principal Problem:   Major depressive disorder, single episode, severe (HCC) Active Problems:   Cocaine-induced mood disorder with depressive  symptoms (HCC)   Chronic pain syndrome  Long Term Goal(s): Improvement in symptoms so as ready for discharge   Short Term Goals: Ability to identify changes in lifestyle to reduce recurrence of condition will improve Ability to verbalize feelings will improve Ability to disclose and discuss suicidal ideas Ability to demonstrate self-control will improve Ability to identify and develop effective coping behaviors will improve Ability to maintain clinical measurements within normal limits will improve Compliance with prescribed medications will improve Ability to identify triggers associated with substance abuse/mental health issues will improve  Medication Management: Evaluate patient's response, side effects, and tolerance of medication regimen.  Therapeutic Interventions: 1 to 1 sessions, Unit Group sessions and Medication administration.  Evaluation of Outcomes: Progressing   RN Treatment Plan for Primary Diagnosis: Major depressive disorder, single episode, severe (HCC) Long Term Goal(s): Knowledge of disease and therapeutic regimen to maintain health will improve  Short Term Goals: Ability to demonstrate self-control, Ability to participate in decision making will improve, Ability to verbalize feelings will improve, Ability to disclose and discuss suicidal ideas, Ability to identify and develop effective coping behaviors will improve, and Compliance with prescribed medications will improve  Medication Management: RN will administer medications as ordered by provider, will assess and evaluate patient's response and provide education to patient for prescribed medication. RN will report any adverse and/or side effects to prescribing provider.  Therapeutic Interventions: 1 on 1 counseling sessions, Psychoeducation, Medication administration, Evaluate responses to treatment, Monitor vital signs and CBGs as ordered, Perform/monitor CIWA, COWS, AIMS and Fall Risk screenings as ordered,  Perform wound care treatments as ordered.  Evaluation of Outcomes: Progressing   LCSW Treatment Plan for Primary Diagnosis: Major depressive disorder, single episode, severe (HCC) Long Term Goal(s): Safe transition to appropriate next level of care at discharge, Engage patient in therapeutic group addressing interpersonal concerns.  Short Term Goals: Engage patient in aftercare planning with referrals and resources, Increase social support, Increase ability to appropriately verbalize feelings, Increase emotional regulation, Facilitate acceptance of mental health diagnosis and concerns, Facilitate patient progression through stages of change regarding substance use diagnoses and concerns, Identify triggers associated with mental health/substance abuse issues, and Increase skills for wellness and recovery  Therapeutic Interventions: Assess for all discharge needs, 1 to 1 time with Social worker, Explore available resources and support systems, Assess for adequacy in community support network, Educate family and significant other(s) on suicide prevention, Complete Psychosocial Assessment, Interpersonal group therapy.  Evaluation of Outcomes: Progressing   Progress in Treatment: Attending groups: No. Participating in groups: No. Taking medication as prescribed: Yes. Toleration medication: Yes. Family/Significant other contact made: No, will contact:  once permission has been granted Patient understands diagnosis: Yes. Discussing patient identified problems/goals with staff: Yes. Medical problems stabilized or resolved: Yes. Denies suicidal/homicidal ideation: Yes. Issues/concerns per patient self-inventory: No. Other: none  New problem(s) identified: No, Describe:  none identified.  Update 08/04/2023:  No changes at this time.    New Short Term/Long Term Goal(s): medication management for mood stabilization; elimination of SI thoughts; development of comprehensive mental wellness/sobriety  plan.  Update 08/04/2023:  No changes at this time.    Patient Goals:  "Like to have help in finding a stable place to stay."  Update 08/04/2023:  No changes at this time.    Discharge Plan or Barriers: CSW will assist pt with development of an appropriate aftercare/discharge plan. Update 08/04/2023:  Patient has been admitted to [facility] for 08/05/2023.  Patient reports apprehension and wanted to review alternative placement options.  CSW has provided patient with list of alternatives and applications to be completed.    Reason for Continuation of Hospitalization: Anxiety Depression Medication stabilization Suicidal ideation   Estimated Length of Stay: 1-7 days  Update 08/04/2023:  TBD  Last 3 [place] Suicide Severity Risk Score: Flowsheet Row Admission (Current) from 07/28/2023 in [facility] ED from 07/27/2023 in [facility] Department [facility]  C-SSRS RISK CATEGORY Low Risk No Risk High Risk  Last PHQ 2/9 Scores:     No data to display          Scribe for Treatment Team: Idella Major 08/04/2023 12:36 PM

## 2023-08-04 NOTE — Progress Notes (Signed)
   08/04/23 1445  Spiritual Encounters  Type of Visit Initial  Care provided to: Patient  Conversation partners present during encounter Nurse  Referral source Nurse (RN/NT/LPN)  Reason for visit Routine spiritual support  OnCall Visit No   Chaplain visited with patient and offered compassionate presence, reflective listening and empathy about life choices and concerns.    Rev. Rana M. Nolon Baxter, MDiv. Chaplain Resident [facility]

## 2023-08-04 NOTE — Group Note (Signed)
 [facility] LCSW Group Therapy Note    Group Date: 08/04/2023 Start Time: 1300 End Time: 1345  Type of Therapy and Topic:  Group Therapy:  Overcoming Obstacles  Participation Level:  [facility] PARTICIPATION LEVEL: Did Not Attend  Description of Group:   In this group patients will be encouraged to explore what they see as obstacles to their own wellness and recovery. They will be guided to discuss their thoughts, feelings, and behaviors related to these obstacles. The group will process together ways to cope with barriers, with attention given to specific choices patients can make. Each patient will be challenged to identify changes they are motivated to make in order to overcome their obstacles. This group will be process-oriented, with patients participating in exploration of their own experiences as well as giving and receiving support and challenge from other group members.  Therapeutic Goals: 1. Patient will identify personal and current obstacles as they relate to admission. 2. Patient will identify barriers that currently interfere with their wellness or overcoming obstacles.  3. Patient will identify feelings, thought process and behaviors related to these barriers. 4. Patient will identify two changes they are willing to make to overcome these obstacles:    Summary of Patient Progress X   Therapeutic Modalities:   Cognitive Behavioral Therapy Solution Focused Therapy Motivational Interviewing Relapse Prevention Therapy   Randolm Butte, LCSW

## 2023-08-04 NOTE — BHH Counselor (Signed)
 CSW has sent fax to BATS program.  Fax was successful.  Shasta Deist, MSW, LCSW 08/04/2023 4:29 PM

## 2023-08-04 NOTE — Plan of Care (Signed)
  Problem: Education: Goal: Knowledge of [place] [facility] Education information/materials will improve Outcome: Progressing Goal: Emotional status will improve Outcome: Progressing Goal: Mental status will improve Outcome: Progressing Goal: Verbalization of understanding the information provided will improve Outcome: Progressing   Problem: Activity: Goal: Interest or engagement in activities will improve Outcome: Progressing Goal: Sleeping patterns will improve Outcome: Progressing   Problem: Coping: Goal: Ability to verbalize frustrations and anger appropriately will improve Outcome: Progressing Goal: Ability to demonstrate self-control will improve Outcome: Progressing   Problem: Health Behavior/Discharge Planning: Goal: Identification of resources available to assist in meeting health care needs will improve Outcome: Progressing Goal: Compliance with treatment plan for underlying cause of condition will improve Outcome: Progressing   Problem: Physical Regulation: Goal: Ability to maintain clinical measurements within normal limits will improve Outcome: Progressing   Problem: Safety: Goal: Periods of time without injury will increase Outcome: Progressing   Problem: Education: Goal: Utilization of techniques to improve thought processes will improve Outcome: Progressing Goal: Knowledge of the prescribed therapeutic regimen will improve Outcome: Progressing   Problem: Activity: Goal: Interest or engagement in leisure activities will improve Outcome: Progressing Goal: Imbalance in normal sleep/wake cycle will improve Outcome: Progressing   Problem: Coping: Goal: Coping ability will improve Outcome: Progressing Goal: Will verbalize feelings Outcome: Progressing   Problem: Health Behavior/Discharge Planning: Goal: Ability to make decisions will improve Outcome: Progressing Goal: Compliance with therapeutic regimen will improve Outcome: Progressing    Problem: Role Relationship: Goal: Will demonstrate positive changes in social behaviors and relationships Outcome: Progressing   Problem: Safety: Goal: Ability to disclose and discuss suicidal ideas will improve Outcome: Progressing Goal: Ability to identify and utilize support systems that promote safety will improve Outcome: Progressing   Problem: Self-Concept: Goal: Will verbalize positive feelings about self Outcome: Progressing Goal: Level of anxiety will decrease Outcome: Progressing   Problem: Education: Goal: Ability to make informed decisions regarding treatment will improve Outcome: Progressing   Problem: Coping: Goal: Coping ability will improve Outcome: Progressing   Problem: Health Behavior/Discharge Planning: Goal: Identification of resources available to assist in meeting health care needs will improve Outcome: Progressing   Problem: Medication: Goal: Compliance with prescribed medication regimen will improve Outcome: Progressing   Problem: Self-Concept: Goal: Ability to disclose and discuss suicidal ideas will improve Outcome: Progressing Goal: Will verbalize positive feelings about self Outcome: Progressing Note: Patient is on track. Patient will work on increased adherence

## 2023-08-04 NOTE — Progress Notes (Signed)
 Care of the patient taken over at 11:45pm . The patient was resting in the bed and seemed to rest well through out the night.

## 2023-08-04 NOTE — Group Note (Signed)
 Date:  08/04/2023 Time:  9:58 AM  Group Topic/Focus:  Goals Group:   The focus of this group is to help patients establish daily goals to achieve during treatment and discuss how the patient can incorporate goal setting into their daily lives to aide in recovery. Wellness Toolbox:   The focus of this group is to discuss various aspects of wellness, balancing those aspects and exploring ways to increase the ability to experience wellness.  Patients will create a wellness toolbox for use upon discharge.    Participation Level:  Did Not Attend   Brian Daugherty 08/04/2023, 9:58 AM

## 2023-08-04 NOTE — Group Note (Signed)
 Recreation Therapy Group Note   Group Topic:Relaxation  Group Date: 08/04/2023 Start Time: 1530 End Time: 1610 Facilitators: Yvonna Herder, CTRS Location:  C[facility] Room  Group Description: Meditation. LRT and patients discussed what they know about meditation and mindfulness. LRT played a Deep Breathing Meditation exercise script for patients to follow along to. LRT and patients discussed how meditation and deep breathing can be used as a coping skill post--discharge to help manage symptoms of stress.   Goal Area(s) Addressed: Patient will practice using relaxation technique. Patient will identify a new coping skill.  Patient will follow multistep directions to reduce anxiety and stress.   Affect/Mood: N/A   Participation Level: Did not attend    Clinical Observations/Individualized Feedback: Patient did not attend group.   Plan: Continue to engage patient in RT group sessions 2-3x/week.   [address], LRT, CTRS 08/04/2023 5:02 PM

## 2023-08-04 NOTE — [facility] Counselor (Signed)
 CSW met with the patient at patient's request.   Pt requested information on [facility].  CSW reminded patient of conversations last week with the CSW team and patient completing a phone interview.  CSW provided education on the program.  Pt requested information on a year long program for SUD.  CSW discussed with the patient [employer].  CSW provided patient with the handout of SUD facilities, highlighting the [employer] phone number.  CSW discussed that patient had attempted [facility], however was denied.  CSW pointed out that patient has been accepted to [facility] and that facility may make additional referrals if they assess pt and deem it necessary.   Pt requested information on the [facility] program.  CSW provided psycho-education and the application packet for completion.  CSW later observed patient to be on the phone making phone calls.   Shasta Deist, MSW, LCSW 08/04/2023 10:56 AM

## 2023-08-05 DIAGNOSIS — F322 Major depressive disorder, single episode, severe without psychotic features: Secondary | ICD-10-CM | POA: Diagnosis not present

## 2023-08-05 NOTE — Progress Notes (Signed)
Patient ID: Brian Daugherty, male   DOB: 08-10-1965, 58 y.o.   MRN: [id number]  Discharge Note:  Patient denies SI/HI/AVH at this time. Discharge instructions, AVS, medication supply, and transition record gone over with patient. Patient declined on filling out a Suicide Safety Plan. Patient agrees to comply with medication management, follow-up visit, and outpatient therapy. Patient belongings returned to patient. Patient questions and concerns addressed and answered. Patient ambulatory off unit. Patient discharged to [facility] via [employer] [facility] [facility].

## 2023-08-05 NOTE — Progress Notes (Signed)
   08/05/23 0847  Psych Admission Type (Psych Patients Only)  Admission Status Voluntary  Psychosocial Assessment  Patient Complaints None  Eye Contact Fair  Facial Expression Worried  Affect Anxious;Appropriate to circumstance  Speech Logical/coherent  Interaction Assertive  Motor Activity Slow  Appearance/Hygiene Unremarkable  Behavior Characteristics Cooperative;Appropriate to situation  Mood Anxious;Pleasant  Aggressive Behavior  Effect No apparent injury  Thought Process  Coherency WDL  Content WDL  Delusions None reported or observed  Perception WDL  Hallucination None reported or observed  Judgment WDL  Confusion None  Danger to Self  Current suicidal ideation? Denies  Self-Injurious Behavior No self-injurious ideation or behavior indicators observed or expressed   Agreement Not to Harm Self Yes  Description of Agreement Verbal  Danger to Others  Danger to Others None reported or observed

## 2023-08-05 NOTE — Plan of Care (Signed)
?  Problem: Education: ?Goal: Knowledge of [place] [facility] Education information/materials will improve ?Outcome: Adequate for Discharge ?Goal: Emotional status will improve ?Outcome: Adequate for Discharge ?Goal: Mental status will improve ?Outcome: Adequate for Discharge ?Goal: Verbalization of understanding the information provided will improve ?Outcome: Adequate for Discharge ?  ?Problem: Activity: ?Goal: Interest or engagement in activities will improve ?Outcome: Adequate for Discharge ?Goal: Sleeping patterns will improve ?Outcome: Adequate for Discharge ?  ?Problem: Coping: ?Goal: Ability to verbalize frustrations and anger appropriately will improve ?Outcome: Adequate for Discharge ?Goal: Ability to demonstrate self-control will improve ?Outcome: Adequate for Discharge ?  ?Problem: Health Behavior/Discharge Planning: ?Goal: Identification of resources available to assist in meeting health care needs will improve ?Outcome: Adequate for Discharge ?Goal: Compliance with treatment plan for underlying cause of condition will improve ?Outcome: Adequate for Discharge ?  ?Problem: Physical Regulation: ?Goal: Ability to maintain clinical measurements within normal limits will improve ?Outcome: Adequate for Discharge ?  ?Problem: Safety: ?Goal: Periods of time without injury will increase ?Outcome: Adequate for Discharge ?  ?Problem: Education: ?Goal: Utilization of techniques to improve thought processes will improve ?Outcome: Adequate for Discharge ?Goal: Knowledge of the prescribed therapeutic regimen will improve ?Outcome: Adequate for Discharge ?  ?Problem: Activity: ?Goal: Interest or engagement in leisure activities will improve ?Outcome: Adequate for Discharge ?Goal: Imbalance in normal sleep/wake cycle will improve ?Outcome: Adequate for Discharge ?  ?Problem: Coping: ?Goal: Coping ability will improve ?Outcome: Adequate for Discharge ?Goal: Will verbalize feelings ?Outcome: Adequate for Discharge ?   ?Problem: Health Behavior/Discharge Planning: ?Goal: Ability to make decisions will improve ?Outcome: Adequate for Discharge ?Goal: Compliance with therapeutic regimen will improve ?Outcome: Adequate for Discharge ?  ?Problem: Role Relationship: ?Goal: Will demonstrate positive changes in social behaviors and relationships ?Outcome: Adequate for Discharge ?  ?Problem: Safety: ?Goal: Ability to disclose and discuss suicidal ideas will improve ?Outcome: Adequate for Discharge ?Goal: Ability to identify and utilize support systems that promote safety will improve ?Outcome: Adequate for Discharge ?  ?Problem: Self-Concept: ?Goal: Will verbalize positive feelings about self ?Outcome: Adequate for Discharge ?Goal: Level of anxiety will decrease ?Outcome: Adequate for Discharge ?  ?Problem: Education: ?Goal: Ability to make informed decisions regarding treatment will improve ?Outcome: Adequate for Discharge ?  ?Problem: Coping: ?Goal: Coping ability will improve ?Outcome: Adequate for Discharge ?  ?Problem: Health Behavior/Discharge Planning: ?Goal: Identification of resources available to assist in meeting health care needs will improve ?Outcome: Adequate for Discharge ?  ?Problem: Medication: ?Goal: Compliance with prescribed medication regimen will improve ?Outcome: Adequate for Discharge ?  ?Problem: Self-Concept: ?Goal: Ability to disclose and discuss suicidal ideas will improve ?Outcome: Adequate for Discharge ?Goal: Will verbalize positive feelings about self ?Outcome: Adequate for Discharge ?  ?

## 2023-08-05 NOTE — Plan of Care (Signed)
Problem: Education: Goal: Emotional status will improve Outcome: Progressing Goal: Mental status will improve Outcome: Progressing

## 2023-08-08 NOTE — [facility] Suicide Risk Assessment (Signed)
 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)   Chronic pain syndrome   Total Time spent with patient: 45 minutes  Musculoskeletal: Strength & Muscle Tone: within normal limits Gait & Station: normal Patient leans: N/A  Psychiatric Specialty Exam  Presentation  General Appearance:  Appropriate for Environment; Neat  Eye Contact: Good  Speech: Clear and Coherent; Normal Rate  Speech Volume: Normal  Handedness: Right   Mood and Affect  Mood: Anxious  Duration of Depression Symptoms: No data recorded Affect: Depressed; Flat   Thought Process  Thought Processes: Coherent  Descriptions of Associations:Intact  Orientation:Full (Time, Place and Person)  Thought Content:Logical; WDL  History of Schizophrenia/Schizoaffective disorder:No  Duration of Psychotic Symptoms:N/A  Hallucinations:No data recorded  Ideas of Reference:None  Suicidal Thoughts:No data recorded  Homicidal Thoughts:No data recorded   Sensorium  Memory: Immediate Good; Recent Good; Remote Good  Judgment: Fair  Insight: Fair   Art therapist  Concentration: Fair  Attention Span: Fair  Recall: Good  Fund of Knowledge: Good  Language: Good   Psychomotor Activity  Psychomotor Activity: No data recorded   Assets  Assets: Communication Skills; Desire for Improvement; Talents/Skills; Resilience   Sleep  Sleep: No data recorded   Physical Exam: Physical Exam ROS Blood pressure 116/74, pulse 62, temperature 97.7 F (36.5 C), resp. rate 20, height 5\' 11"  (1.803 m), weight 73.7 kg, SpO2 97%. Body mass index is 22.66 kg/m.  Mental Status Per Nursing  Assessment::   On Admission:  Self-harm thoughts  Demographic Factors:  Male and Unemployed  Loss Factors: NA  Historical Factors: Impulsivity  Risk Reduction Factors:   Sense of responsibility to family and Positive social support  Continued Clinical Symptoms:  Depression:   Insomnia Alcohol/Substance Abuse/Dependencies Chronic Pain Previous Psychiatric Diagnoses and Treatments Medical Diagnoses and Treatments/Surgeries  Cognitive Features That Contribute To Risk:  None    Suicide Risk:  Minimal: No identifiable suicidal ideation.  Patients presenting with no risk factors but with morbid ruminations; may be classified as minimal risk based on the severity of the depressive symptoms   Follow-up Information     [facility] Follow up.   Specialty: Addiction Medicine Why: Patient to arriveby 10AM Contact information: [address] [place] [address] [phone]                 Plan Of Care/Follow-up recommendations:   It is recommended to the patient to continue psychiatric medications as prescribed, after discharge from the hospital.   - It is recommended to the patient to follow up with your outpatient psychiatric provider and PCP. - It was discussed with the patient, the impact of alcohol, drugs, tobacco have been there overall psychiatric and medical wellbeing, and total abstinence from substance use was recommended the patient. - 30-day supply of medications provided. Patient agreeable to plan. Given opportunity to ask questions. Appears to feel comfortable with discharge.   - In the event of worsening symptoms, the patient is instructed to call the crisis hotline, 911 and or go to the nearest ED for appropriate evaluation and treatment of symptoms. To follow-up with primary care provider for other medical issues, concerns and or health care needs - Patient will be discharged to [facility]  Robyne Peers, NP

## 2023-08-08 NOTE — Discharge Summary (Signed)
Physician Discharge Summary Note  Patient:  Brian Daugherty is an 58 y.o., male MRN:  [id number] DOB:  [birth year] Patient phone:  [phone] (home)  Patient address:   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 No pertinent family history. Family Psychiatric  History: see H&P Social History:  Social History   Substance and Sexual Activity  Alcohol Use No     Social History   Substance and Sexual Activity  Drug Use Yes   Types: Cocaine, Marijuana    Social History   Socioeconomic History   Marital status: Single    Spouse name: Not on file   Number of children: Not on file   Years of education: Not on file   Highest education level: Not on file  Occupational History   Not on file  Tobacco Use   Smoking status: Every Day    Current packs/day: 0.50    Types: Cigarettes   Smokeless tobacco: Never  Substance and Sexual Activity   Alcohol use: No   Drug use: Yes    Types: Cocaine, Marijuana   Sexual activity: Not Currently  Other Topics Concern   Not on file  Social History Narrative   Not on file   Social Drivers of Health   Financial Resource Strain: Not on file  Food Insecurity: Food Insecurity Present (07/28/2023)    Hunger Vital Sign    Worried About Running Out of Food in the Last Year: Often true    Ran Out of Food in the Last Year: Often true  Transportation Needs: Unmet Transportation Needs (07/28/2023)   PRAPARE - Administrator, Civil Service (Medical): Yes    Lack of Transportation (Non-Medical): Yes  Physical Activity: Not on file  Stress: Not on file  Social Connections: Unknown (11/03/2021)   [facility] from [facility]   Social Network    Social Network: Not on file    Hospital Course:  58 year old male with a history of chronic lower back pain with right lower extremity radiculopathy followed by [facility], presenting for evaluation of worsening right leg pain and suicidal ideation. The patient states that he woke up this morning with increased right leg pain despite taking medications as prescribed. He recently had an MRI, which showed nerve impingement on the right. He denies recent falls, injuries, numbness, weakness, bowel or bladder incontinence, saddle paresthesia, history of IV drug use, fever, abdominal pain, or history of known AAA/dissection. He has been out of work for an extended period due to his chronic pain.The patient expresses active suicidal ideation (SI), citing worsening depression due to decreased mobility and chronic pain. He states that he fears overdosing on his medications if discharged today. He reports a history of cocaine use disorder, stating that  he has been using cocaine almost daily for the past few years, drinks alcohol occasionally (not daily), and uses marijuana. He denies homicidal ideation (HI) and auditory/visual hallucinations (AVH)Psychiatric history is significant for a prior suicide attempt approximately 10 years ago via medication overdose. However, he has never received psychiatric treatment or taken psychotropic medications.The patient describes significant recent psychosocial stressors, including the death of his mother a few months  ago, eviction from his living situation, and now experiencing homelessness for the first time. He reports feeling isolated and without support, stating that his mother was the only family member he spoke to. He is visibly distressed, tearful, and states, "I have no reason to live." He describes plans to overdose on drugs or stab himself with a knife if discharged.The patient is requesting help for his depression, achieving sobriety, and securing outpatient resources to regain stability. Due to his active SI and inability to contract for safety, inpatient psychiatric hospitalization is necessary.   During his hospitalization, patient was started on Cymbalta to target MDD, trazodone and melatonin to target insomnia, and hydroxyzine to target anxiety. The patient denies having side effects to prescribed psychiatric medication. Gradually, the patient started adjusting to milieu. The patient was evaluated each day by a clinical provider to ascertain response to treatment. Improvement was noted by the patient's report of decreasing symptoms, improved sleep and appetite, affect, medication tolerance, behavior, and participation in unit programming.  Patient was asked each day to complete a self inventory noting mood, mental status, pain, new symptoms, anxiety and concerns.     Symptoms were reported as significantly decreased or resolved completely by discharge. On day of discharge, the patient reports that their mood is stable. The patient denied having suicidal thoughts for more than 48 hours prior to discharge.  Patient denies having homicidal thoughts.  Patient denies having auditory hallucinations.  Patient denies any visual hallucinations or other symptoms of psychosis. The patient was motivated to continue taking medication with a goal of continued improvement in mental health. The patient reports their target psychiatric symptoms of depression, anxiety & insomnia responded well to the psychiatric medications,  and the patient reports overall benefit from this psychiatric hospitalization. Supportive psychotherapy was provided to the patient. The patient also participated in regular group therapy while hospitalized. Coping skills, problem solving as well as relaxation therapies were also part of the unit programming. He was able to verbalize individualized safety plan to this provider.  Physical Findings: AIMS:  , ,  ,  ,    CIWA:    COWS:  COWS Total Score: 0  Musculoskeletal: Strength & Muscle Tone: within normal limits Gait & Station: normal Patient leans: N/A   Psychiatric Specialty Exam:  Presentation  General Appearance:  Appropriate for Environment; Neat  Eye Contact: Good  Speech: Clear and Coherent; Normal Rate  Speech Volume: Normal  Handedness: Right   Mood and Affect  Mood: Anxious Euthymic  Affect: Full range   Thought Process  Thought Processes: Coherent  Descriptions of Associations:Intact  Orientation:Full (Time, Place and Person)  Thought Content:Logical; WDL  History of Schizophrenia/Schizoaffective disorder:No  Duration of Psychotic Symptoms:N/A  Hallucinations:No Ideas of Reference:None  Suicidal Thoughts:No Homicidal Thoughts:No  Sensorium  Memory: Immediate Good; Recent Good; Remote Good  Judgment: Fair  Insight: Fair   Art therapist  Concentration: Fair  Attention Span: Fair  Recall: Good  Fund of Knowledge: Good  Language: Good   Psychomotor Activity  Psychomotor Activity: Normal  Assets  Assets: Communication Skills; Desire for  Improvement; Talents/Skills; Resilience   Sleep  Sleep: Good   Physical Exam: Physical Exam Vitals and nursing note reviewed.  HENT:     Head: Normocephalic and atraumatic.     Nose: Nose normal.     Mouth/Throat:     Mouth: Mucous membranes are moist.  Eyes:     Extraocular Movements: Extraocular movements intact.  Pulmonary:     Effort: Pulmonary effort is  normal.  Musculoskeletal:        General: Normal range of motion.  Skin:    General: Skin is warm and dry.  Neurological:     Mental Status: He is alert and oriented to person, place, and time.  Psychiatric:        Attention and Perception: Attention and perception normal.        Mood and Affect: Mood and affect normal.        Speech: Speech normal.        Behavior: Behavior normal. Behavior is cooperative.        Thought Content: Thought content normal.    Review of Systems  Musculoskeletal:  Positive for myalgias.  Psychiatric/Behavioral:  Negative for depression, hallucinations and suicidal ideas. The patient is nervous/anxious.   All other systems reviewed and are negative.  Blood pressure 116/74, pulse 62, temperature 97.7 F (36.5 C), resp. rate 20, height 5\' 11"  (1.803 m), weight 73.7 kg, SpO2 97%. Body mass index is 22.66 kg/m.   Social History   Tobacco Use  Smoking Status Every Day   Current packs/day: 0.50   Types: Cigarettes  Smokeless Tobacco Never   Tobacco Cessation:  A prescription for an FDA-approved tobacco cessation medication provided at discharge   Blood Alcohol level:  Lab Results  Component Value Date   [facility] <10 07/27/2023   ETH <10 12/15/2022    Metabolic Disorder Labs:  Lab Results  Component Value Date   HGBA1C 5.6 12/17/2022   MPG 114.02 12/17/2022   No results found for: "PROLACTIN" Lab Results  Component Value Date   CHOL 164 12/18/2022   TRIG 101 12/18/2022   HDL 40 (L) 12/18/2022   CHOLHDL 4.1 12/18/2022   VLDL 20 12/18/2022   LDLCALC 104 (H) 12/18/2022   LDLCALC 89 12/17/2022    See Psychiatric Specialty Exam and Suicide Risk Assessment completed by Attending Physician prior to discharge.  Discharge destination:  Other:  long-term substance use treatment program  Is patient on multiple antipsychotic therapies at discharge:  No   Has Patient had three or more failed trials of antipsychotic monotherapy by history:   No  Recommended Plan for Multiple Antipsychotic Therapies: NA  Discharge Instructions     Diet - low sodium heart healthy   Complete by: As directed    Increase activity slowly   Complete by: As directed       Allergies as of 08/05/2023   No Known Allergies      Medication List     STOP taking these medications    FLUoxetine 20 MG capsule Commonly known as: PROZAC   predniSONE 10 MG (21) Tbpk tablet Commonly known as: STERAPRED UNI-PAK 21 TAB   promethazine-dextromethorphan 6.25-15 MG/5ML syrup Commonly known as: PROMETHAZINE-DM   sildenafil 100 MG tablet Commonly known as: VIAGRA       TAKE these medications      Indication  amLODipine 5 MG tablet Commonly known as: NORVASC Take 1 tablet (5 mg total) by mouth daily.  Indication: High Blood Pressure   CertaVite/Antioxidants Tabs Take  1 tablet by mouth daily.  Indication: supplement   DULoxetine 20 MG capsule Commonly known as: CYMBALTA Take 2 capsules (40 mg total) by mouth daily.  Indication: Musculoskeletal Pain, Peripheral Nerve Disease   gabapentin 300 MG capsule Commonly known as: NEURONTIN Take 2 capsules (600 mg total) by mouth at bedtime. What changed: You were already taking a medication with the same name, and this prescription was added. Make sure you understand how and when to take each.  Indication: Abuse or Misuse of Alcohol, Neuropathic Pain   gabapentin 100 MG capsule Commonly known as: NEURONTIN Take 2 capsules (200 mg total) by mouth every morning. What changed:  medication strength how much to take when to take this  Indication: Abuse or Misuse of Alcohol, Neuropathic Pain   hydrOXYzine 25 MG tablet Commonly known as: ATARAX Take 1 tablet (25 mg total) by mouth 3 (three) times daily as needed for anxiety.  Indication: Feeling Anxious, Feeling Tense   ibuprofen 600 MG tablet Commonly known as: ADVIL Take 1 tablet (600 mg total) by mouth every 8 (eight) hours as needed for  moderate pain (pain score 4-6).  Indication: Pain   lidocaine 5 % Commonly known as: Lidoderm Place 1 patch onto the skin daily. Remove & Discard patch within 12 hours or as directed by MD  Indication: Allodynia   melatonin 5 MG Tabs Take 1 tablet (5 mg total) by mouth at bedtime.  Indication: Depression, Trouble Sleeping   nicotine 14 mg/24hr patch Commonly known as: NICODERM CQ - dosed in mg/24 hours Place 1 patch (14 mg total) onto the skin daily for 28 days.  Indication: Nicotine Addiction   tamsulosin 0.4 MG Caps capsule Commonly known as: FLOMAX Take 1 capsule (0.4 mg total) by mouth daily.  Indication: Benign Enlargement of Prostate   traZODone 50 MG tablet Commonly known as: DESYREL Take 1 tablet (50 mg total) by mouth at bedtime as needed for sleep.  Indication: Trouble Sleeping   Ventolin HFA 108 (90 Base) MCG/ACT inhaler Generic drug: albuterol Inhale 1 puff into the lungs every 6 (six) hours as needed for shortness of breath or wheezing.  Indication: Asthma        Follow-up Information     [facility] Follow up.   Specialty: Addiction Medicine Why: Patient to arriveby 10AM Contact information: [address] [place] [address] [phone]                 Follow-up recommendations:   It is recommended to the patient to continue psychiatric medications as prescribed, after discharge from the hospital.   - It is recommended to the patient to follow up with your outpatient psychiatric provider and PCP. - It was discussed with the patient, the impact of alcohol, drugs, tobacco have been there overall psychiatric and medical wellbeing, and total abstinence from substance use was recommended the patient. - Prescriptions provided or sent directly to preferred pharmacy at discharge. Patient agreeable to plan. Given opportunity to ask questions. Appears to feel comfortable with discharge.   - In the event of  worsening symptoms, the patient is instructed to call the crisis hotline, 911 and or go to the nearest ED for appropriate evaluation and treatment of symptoms. To follow-up with primary care provider for other medical issues, concerns and or health care needs - Patient was discharged home with a plan to follow up as noted above.   Signed: Annya Lizana Robyne Peers, NP

## 2023-11-21 ENCOUNTER — Other Ambulatory Visit: Payer: Self-pay

## 2023-11-21 ENCOUNTER — Emergency Department (HOSPITAL_BASED_OUTPATIENT_CLINIC_OR_DEPARTMENT_OTHER)
Admission: EM | Admit: 2023-11-21 | Discharge: 2023-11-21 | Disposition: A | Discharge status: Home / Self Care | Payer: MEDICAID | Attending: Emergency Medicine | Admitting: Emergency Medicine

## 2023-11-21 ENCOUNTER — Encounter (HOSPITAL_BASED_OUTPATIENT_CLINIC_OR_DEPARTMENT_OTHER): Payer: Self-pay

## 2023-11-21 DIAGNOSIS — K047 Periapical abscess without sinus: Secondary | ICD-10-CM | POA: Insufficient documentation

## 2023-11-21 DIAGNOSIS — R220 Localized swelling, mass and lump, head: Secondary | ICD-10-CM | POA: Diagnosis present

## 2023-11-21 MED ORDER — AMOXICILLIN 500 MG PO CAPS
1000.0000 mg | ORAL_CAPSULE | Freq: Once | ORAL | Status: AC
  Administered 2023-11-21: 1000 mg via ORAL
  Filled 2023-11-21: qty 2

## 2023-11-21 MED ORDER — IBUPROFEN 600 MG PO TABS: 600.0000 mg | ORAL_TABLET | Freq: Four times a day (QID) | ORAL | 0 refills | Status: AC | PRN

## 2023-11-21 MED ORDER — AMOXICILLIN 500 MG PO CAPS: 1000.0000 mg | ORAL_CAPSULE | Freq: Two times a day (BID) | ORAL | 0 refills | Status: AC

## 2023-11-21 NOTE — ED Triage Notes (Signed)
 Pt states that he has a sore in the top of his mouth. States that something went into the roof of his mouth and is still there. States that his lip is swollen.

## 2023-11-21 NOTE — Discharge Instructions (Signed)
 As we discussed, it is important to take the antibiotic as prescribed and follow up with dentistry for further care.

## 2023-11-21 NOTE — ED Notes (Signed)

## 2023-11-21 NOTE — ED Provider Notes (Signed)
 [facility] Provider Note   CSN: [id number] Arrival date & time: 11/21/23  1203     History  Chief Complaint  Patient presents with   Oral Swelling    Brian Daugherty is a 58 y.o. male.  Patient to ED with painful swelling in the hard palette x 2-3 days. He reports a sore tooth as well. No facial swelling or fever.   The history is provided by the patient. No language interpreter was used.       Home Medications Prior to Admission medications   Medication Sig Start Date End Date Taking? Authorizing Provider  amoxicillin  (AMOXIL ) 500 MG capsule Take 2 capsules (1,000 mg total) by mouth 2 (two) times daily. 11/21/23  Yes Infiniti Hoefling, Clovis Dar, PA-C  ibuprofen  (ADVIL ) 600 MG tablet Take 1 tablet (600 mg total) by mouth every 6 (six) hours as needed. 11/21/23  Yes Marlow Berenguer, Clovis Dar, PA-C  albuterol  (VENTOLIN  HFA) 108 (90 Base) MCG/ACT inhaler Inhale 1 puff into the lungs every 6 (six) hours as needed for shortness of breath or wheezing. 08/01/23 08/31/23  Rosalene Colon, NP  amLODipine  (NORVASC ) 5 MG tablet Take 1 tablet (5 mg total) by mouth daily. 08/01/23 08/31/23  Rosalene Colon, NP  DULoxetine  (CYMBALTA ) 20 MG capsule Take 2 capsules (40 mg total) by mouth daily. 08/02/23 09/01/23  Rosalene Colon, NP  gabapentin  (NEURONTIN ) 100 MG capsule Take 2 capsules (200 mg total) by mouth every morning. 08/02/23 09/01/23  Rosalene Colon, NP  gabapentin  (NEURONTIN ) 300 MG capsule Take 2 capsules (600 mg total) by mouth at bedtime. 08/01/23 08/31/23  Rosalene Colon, NP  traZODone  (DESYREL ) 50 MG tablet Take 1 tablet (50 mg total) by mouth at bedtime as needed for sleep. 08/01/23 08/31/23  Rosalene Colon, NP      Allergies    Patient has no known allergies.    Review of Systems   Review of Systems  Physical Exam Updated Vital Signs BP 122/78   Pulse 64   Temp 97.9 F (36.6 C) (Oral)   Resp 18   Ht 5\' 11"  (1.803 m)   Wt 79.4 kg   SpO2 99%   BMI 24.41 kg/m  Physical  Exam Constitutional:      Appearance: He is well-developed.  HENT:     Mouth/Throat:     Comments: Hard palette is swollen adjacent to #9. No facial or lip swelling. Pulmonary:     Effort: Pulmonary effort is normal.  Musculoskeletal:        General: Normal range of motion.     Cervical back: Normal range of motion.  Skin:    General: Skin is warm and dry.  Neurological:     Mental Status: He is alert and oriented to person, place, and time.     ED Results / Procedures / Treatments   Labs (all labs ordered are listed, but only abnormal results are displayed) Labs Reviewed - No data to display  EKG None  Radiology No results found.  Procedures Procedures    Medications Ordered in ED Medications  amoxicillin  (AMOXIL ) capsule 1,000 mg (has no administration in time range)    ED Course/ Medical Decision Making/ A&P Clinical Course as of 11/21/23 1350  Fri Nov 21, 2023  1348 Patient with dental abscess requiring abx and dentistry follow up.  [SU]    Clinical Course User Index [SU] Mandy Second, PA-C  Medical Decision Making          Final Clinical Impression(s) / ED Diagnoses Final diagnoses:  Dental abscess    Rx / DC Orders ED Discharge Orders          Ordered    amoxicillin  (AMOXIL ) 500 MG capsule  2 times daily        11/21/23 1350    ibuprofen  (ADVIL ) 600 MG tablet  Every 6 hours PRN        11/21/23 1350              Mandy Second, PA-C 11/21/23 1350    Rafael Bun A, DO 11/25/23 2010
